# Patient Record
Sex: Male | Born: 1990 | Race: White | Hispanic: Yes | Marital: Single | Smoking: Never smoker
Health system: Southern US, Community
[De-identification: ages and names within clinical notes are randomized; demographics above are authoritative.]

---

## 2011-08-04 ENCOUNTER — Encounter (HOSPITAL_COMMUNITY): Payer: Self-pay | Admitting: *Deleted

## 2011-08-04 ENCOUNTER — Emergency Department (HOSPITAL_COMMUNITY)
Admission: EM | Admit: 2011-08-04 | Discharge: 2011-08-04 | Disposition: A | Discharge status: Home / Self Care | Payer: Self-pay | Attending: Emergency Medicine | Admitting: Emergency Medicine

## 2011-08-04 DIAGNOSIS — R509 Fever, unspecified: Secondary | ICD-10-CM | POA: Insufficient documentation

## 2011-08-04 DIAGNOSIS — R599 Enlarged lymph nodes, unspecified: Secondary | ICD-10-CM | POA: Insufficient documentation

## 2011-08-04 DIAGNOSIS — J020 Streptococcal pharyngitis: Secondary | ICD-10-CM | POA: Insufficient documentation

## 2011-08-04 DIAGNOSIS — R51 Headache: Secondary | ICD-10-CM | POA: Insufficient documentation

## 2011-08-04 DIAGNOSIS — R630 Anorexia: Secondary | ICD-10-CM | POA: Insufficient documentation

## 2011-08-04 MED ORDER — HYDROCODONE-ACETAMINOPHEN 7.5-500 MG/15ML PO SOLN: 15.0000 mL | Freq: Four times a day (QID) | ORAL | Status: AC | PRN

## 2011-08-04 MED ORDER — PENICILLIN G BENZATHINE 1200000 UNIT/2ML IM SUSP
1.2000 10*6.[IU] | Freq: Once | INTRAMUSCULAR | Status: AC
  Administered 2011-08-04: 1.2 10*6.[IU] via INTRAMUSCULAR
  Filled 2011-08-04: qty 2

## 2011-08-04 MED ORDER — DEXAMETHASONE SODIUM PHOSPHATE 10 MG/ML IJ SOLN
10.0000 mg | Freq: Once | INTRAMUSCULAR | Status: AC
  Administered 2011-08-04: 10 mg via INTRAMUSCULAR
  Filled 2011-08-04: qty 1

## 2011-08-04 NOTE — ED Notes (Signed)
Pt to ED c/o sore throat and headache x 1 week that is just getting worse to the point where it is difficult to swallow.  Tonsils swollen and red.

## 2011-08-04 NOTE — ED Notes (Signed)
sorethroat for several days

## 2011-08-04 NOTE — Discharge Instructions (Signed)
Salt Water Gargle  This solution will help make your mouth and throat feel better.  HOME CARE INSTRUCTIONS     Mix 1 teaspoon of salt in 8 ounces of warm water.   Gargle with this solution as much or often as you need or as directed. Swish and gargle gently if you have any sores or wounds in your mouth.   Do not swallow this mixture.  Document Released: 11/03/2003 Document Revised: 01/18/2011 Document Reviewed: 03/26/2008  ExitCare Patient Information 2012 [employer]    Strep Throat  Strep throat is an infection of the throat caused by a bacteria named Streptococcus pyogenes. Your caregiver may call the infection streptococcal "tonsillitis" or "pharyngitis" depending on whether there are signs of inflammation in the tonsils or back of the throat. Strep throat is most common in children from 5 to 15 years old during the cold months of the year, but it can occur in people of any age during any season. This infection is spread from person to person (contagious) through coughing, sneezing, or other close contact.  SYMPTOMS     Fever or chills.   Painful, swollen, red tonsils or throat.   Pain or difficulty when swallowing.   White or yellow spots on the tonsils or throat.   Swollen, tender lymph nodes or "glands" of the neck or under the jaw.   Red rash all over the body (rare).  DIAGNOSIS    Many different infections can cause the same symptoms. A test must be done to confirm the diagnosis so the right treatment can be given. A "rapid strep test" can help your caregiver make the diagnosis in a few minutes. If this test is not available, a light swab of the infected area can be used for a throat culture test. If a throat culture test is done, results are usually available in a day or two.  TREATMENT    Strep throat is treated with antibiotic medicine.  HOME CARE INSTRUCTIONS     Gargle with 1 tsp of salt in 1 cup of warm water, 3 to 4 times per day or as needed for comfort.    Family members who also have a sore throat or fever should be tested for strep throat and treated with antibiotics if they have the strep infection.   Make sure everyone in your household washes their hands well.   Do not share food, drinking cups, or personal items that could cause the infection to spread to others.   You may need to eat a soft food diet until your sore throat gets better.   Drink enough water and fluids to keep your urine clear or pale yellow. This will help prevent dehydration.   Get plenty of rest.   Stay home from school, daycare, or work until you have been on antibiotics for 24 hours.   Only take over-the-counter or prescription medicines for pain, discomfort, or fever as directed by your caregiver.   If antibiotics are prescribed, take them as directed. Finish them even if you start to feel better.  SEEK MEDICAL CARE IF:     The glands in your neck continue to enlarge.   You develop a rash, cough, or earache.   You cough up green, yellow-brown, or bloody sputum.   You have pain or discomfort not controlled by medicines.   Your problems seem to be getting worse rather than better.  SEEK IMMEDIATE MEDICAL CARE IF:     You develop any new symptoms such   as vomiting, severe headache, stiff or painful neck, chest pain, shortness of breath, or trouble swallowing.   You develop severe throat pain, drooling, or changes in your voice.   You develop swelling of the neck, or the skin on the neck becomes red and tender.   You have a fever.   You develop signs of dehydration, such as fatigue, dry mouth, and decreased urination.   You become increasingly sleepy, or you cannot wake up completely.  Document Released: 01/27/2000 Document Revised: 01/18/2011 Document Reviewed: 03/30/2010  ExitCare Patient Information 2012 [employer].

## 2011-08-04 NOTE — ED Provider Notes (Signed)
History     CSN: [id number]  Arrival date & time 08/04/11  0002   First MD Initiated Contact with Patient 08/04/11 0057      Chief Complaint  Patient presents with  . Sore Throat    (Consider location/radiation/quality/duration/timing/severity/associated sxs/prior treatment) HPI Comments: Patient here with sore throat that is progressively worsening over the past week - states subjective fever and chills, decrease in appetite and headache - states now with increased pain with swallowing but is able to swallow secretions.  Reports no prior history of strep throat - denies nausea, vomiting, chest pain, cough, abdominal pain.  Patient is a 21 y.o. male presenting with pharyngitis. The history is provided by the patient. No language interpreter was used.  Sore Throat This is a new problem. The current episode started in the past 7 days. The problem occurs constantly. The problem has been rapidly worsening. Associated symptoms include anorexia, chills, a fever, headaches, myalgias, a sore throat and swollen glands. Pertinent negatives include no abdominal pain, arthralgias, change in bowel habit, chest pain, congestion, coughing, diaphoresis, fatigue, joint swelling, nausea, neck pain, numbness, rash, urinary symptoms, vertigo, visual change, vomiting or weakness. The symptoms are aggravated by swallowing. He has tried nothing for the symptoms. The treatment provided no relief.    History reviewed. No pertinent past medical history.  History reviewed. No pertinent past surgical history.  No family history on file.  History  Substance Use Topics  . Smoking status: Not on file  . Smokeless tobacco: Not on file  . Alcohol Use: Not on file      Review of Systems  Constitutional: Positive for fever and chills. Negative for diaphoresis and fatigue.  HENT: Positive for sore throat. Negative for congestion, neck pain and voice change.   Respiratory: Negative for cough.   Cardiovascular:  Negative for chest pain.  Gastrointestinal: Positive for anorexia. Negative for nausea, vomiting, abdominal pain and change in bowel habit.  Musculoskeletal: Positive for myalgias. Negative for joint swelling and arthralgias.  Skin: Negative for rash.  Neurological: Positive for headaches. Negative for vertigo, weakness and numbness.  All other systems reviewed and are negative.    Allergies  Review of patient's allergies indicates no known allergies.  Home Medications   Current Outpatient Rx  Name Route Sig Dispense Refill  . AMOXICILLIN 500 MG PO CAPS Oral Take 500 mg by mouth 3 (three) times daily.      BP 140/83  Pulse 100  Temp 99.3 F (37.4 C) (Oral)  SpO2 99%  Physical Exam  Nursing note and vitals reviewed. Constitutional: He is oriented to person, place, and time. He appears well-developed and well-nourished. No distress.  HENT:  Head: Normocephalic and atraumatic.  Right Ear: External ear normal.  Left Ear: External ear normal.  Nose: Nose normal.  Mouth/Throat: Oropharyngeal exudate present.  Eyes: Conjunctivae are normal. Pupils are equal, round, and reactive to light. No scleral icterus.  Neck: Normal range of motion. Neck supple.       Bilateral anterior and posterior cervical adenopathy  Cardiovascular: Normal rate, regular rhythm and normal heart sounds.  Exam reveals no gallop and no friction rub.   No murmur heard. Pulmonary/Chest: Effort normal and breath sounds normal. No respiratory distress. He has no wheezes. He has no rales. He exhibits no tenderness.  Abdominal: Soft. Bowel sounds are normal. He exhibits no distension. There is no tenderness.  Musculoskeletal: Normal range of motion. He exhibits no edema and no tenderness.  Lymphadenopathy:  He has cervical adenopathy.  Neurological: He is alert and oriented to person, place, and time. No cranial nerve deficit. He exhibits normal muscle tone. Coordination normal.  Skin: Skin is warm and dry. No  rash noted. No erythema. No pallor.  Psychiatric: He has a normal mood and affect. His behavior is normal. Judgment and thought content normal.    ED Course  Procedures (including critical care time)   Labs Reviewed  RAPID STREP SCREEN   No results found.   Strep pharyngitis   MDM  Although his rapid strep was negative he is a CENTOR score of 4 and clinically appears to have strep throat - given injection of bicillin LA and decadron for the swelling - no not suspect PTA or ludwigs angina, patient to return if unable to handle secretions.        Izola Price [place], [place] 08/04/11 [phone]

## 2011-08-04 NOTE — ED Provider Notes (Signed)
Medical screening examination/treatment/procedure(s) were performed by non-physician practitioner and as supervising physician I was immediately available for consultation/collaboration.  Ethelda Chick, MD 08/04/11 [phone]

## 2011-08-04 NOTE — ED Notes (Signed)
im injections given will need to wait for 15 minutes before discharge

## 2015-03-21 ENCOUNTER — Observation Stay (HOSPITAL_COMMUNITY): Payer: Self-pay | Admitting: Anesthesiology

## 2015-03-21 ENCOUNTER — Emergency Department (HOSPITAL_COMMUNITY): Payer: Self-pay

## 2015-03-21 ENCOUNTER — Encounter (HOSPITAL_COMMUNITY)
Admission: EM | Disposition: A | Discharge status: Home / Self Care | Payer: Self-pay | Source: Home / Self Care | Attending: Emergency Medicine

## 2015-03-21 ENCOUNTER — Observation Stay (HOSPITAL_COMMUNITY)
Admission: EM | Admit: 2015-03-21 | Discharge: 2015-03-22 | Disposition: A | Discharge status: Home / Self Care | Payer: Self-pay | Attending: Orthopedic Surgery | Admitting: Orthopedic Surgery

## 2015-03-21 ENCOUNTER — Encounter (HOSPITAL_COMMUNITY): Payer: Self-pay | Admitting: *Deleted

## 2015-03-21 ENCOUNTER — Observation Stay (HOSPITAL_COMMUNITY): Payer: MEDICAID | Admitting: Anesthesiology

## 2015-03-21 DIAGNOSIS — S5292XA Unspecified fracture of left forearm, initial encounter for closed fracture: Secondary | ICD-10-CM

## 2015-03-21 DIAGNOSIS — S52509A Unspecified fracture of the lower end of unspecified radius, initial encounter for closed fracture: Secondary | ICD-10-CM | POA: Diagnosis present

## 2015-03-21 DIAGNOSIS — M25532 Pain in left wrist: Principal | ICD-10-CM | POA: Insufficient documentation

## 2015-03-21 DIAGNOSIS — S59202A Unspecified physeal fracture of lower end of radius, left arm, initial encounter for closed fracture: Secondary | ICD-10-CM | POA: Insufficient documentation

## 2015-03-21 DIAGNOSIS — R200 Anesthesia of skin: Secondary | ICD-10-CM | POA: Insufficient documentation

## 2015-03-21 DIAGNOSIS — W19XXXA Unspecified fall, initial encounter: Secondary | ICD-10-CM | POA: Insufficient documentation

## 2015-03-21 HISTORY — PX: OPEN REDUCTION INTERNAL FIXATION (ORIF) DISTAL RADIAL FRACTURE: SHX5989

## 2015-03-21 LAB — BASIC METABOLIC PANEL
Anion gap: 11 (ref 5–15)
BUN: 10 mg/dL (ref 6–20)
CO2: 25 mmol/L (ref 22–32)
CREATININE: 0.81 mg/dL (ref 0.61–1.24)
Calcium: 8.9 mg/dL (ref 8.9–10.3)
Chloride: 106 mmol/L (ref 101–111)
Glucose, Bld: 90 mg/dL (ref 65–99)
POTASSIUM: 3.7 mmol/L (ref 3.5–5.1)
SODIUM: 142 mmol/L (ref 135–145)

## 2015-03-21 LAB — CBC WITH DIFFERENTIAL/PLATELET
BASOS ABS: 0 10*3/uL (ref 0.0–0.1)
BASOS PCT: 0 %
EOS ABS: 0.1 10*3/uL (ref 0.0–0.7)
EOS PCT: 1 %
HCT: 44.1 % (ref 39.0–52.0)
HEMOGLOBIN: 15.7 g/dL (ref 13.0–17.0)
LYMPHS ABS: 3 10*3/uL (ref 0.7–4.0)
Lymphocytes Relative: 29 %
MCH: 31.3 pg (ref 26.0–34.0)
MCHC: 35.6 g/dL (ref 30.0–36.0)
MCV: 88 fL (ref 78.0–100.0)
Monocytes Absolute: 0.9 10*3/uL (ref 0.1–1.0)
Monocytes Relative: 9 %
NEUTROS PCT: 61 %
Neutro Abs: 6.2 10*3/uL (ref 1.7–7.7)
PLATELETS: 205 10*3/uL (ref 150–400)
RBC: 5.01 MIL/uL (ref 4.22–5.81)
RDW: 12.5 % (ref 11.5–15.5)
WBC: 10.3 10*3/uL (ref 4.0–10.5)

## 2015-03-21 LAB — SURGICAL PCR SCREEN
MRSA, PCR: NEGATIVE
STAPHYLOCOCCUS AUREUS: NEGATIVE

## 2015-03-21 SURGERY — OPEN REDUCTION INTERNAL FIXATION (ORIF) DISTAL RADIUS FRACTURE
Anesthesia: General | Site: Arm Lower | Laterality: Left

## 2015-03-21 MED ORDER — HYDROMORPHONE HCL 1 MG/ML IJ SOLN
1.0000 mg | Freq: Once | INTRAMUSCULAR | Status: AC
  Administered 2015-03-21: 1 mg via INTRAMUSCULAR

## 2015-03-21 MED ORDER — PROPOFOL 10 MG/ML IV BOLUS
INTRAVENOUS | Status: DC | PRN
  Administered 2015-03-21: 200 mg via INTRAVENOUS

## 2015-03-21 MED ORDER — ALPRAZOLAM 0.5 MG PO TABS
0.5000 mg | ORAL_TABLET | Freq: Four times a day (QID) | ORAL | Status: DC | PRN
  Administered 2015-03-22 (×3): 0.5 mg via ORAL
  Filled 2015-03-21 (×3): qty 1

## 2015-03-21 MED ORDER — 0.9 % SODIUM CHLORIDE (POUR BTL) OPTIME
TOPICAL | Status: DC | PRN
  Administered 2015-03-21: 1000 mL

## 2015-03-21 MED ORDER — FENTANYL CITRATE (PF) 250 MCG/5ML IJ SOLN
INTRAMUSCULAR | Status: AC
  Filled 2015-03-21: qty 5

## 2015-03-21 MED ORDER — BUPIVACAINE HCL (PF) 0.25 % IJ SOLN: INTRAMUSCULAR | Status: DC | PRN

## 2015-03-21 MED ORDER — LIDOCAINE HCL (CARDIAC) 20 MG/ML IV SOLN
INTRAVENOUS | Status: DC | PRN
  Administered 2015-03-21: 60 mg via INTRAVENOUS

## 2015-03-21 MED ORDER — VITAMIN C 500 MG PO TABS
1000.0000 mg | ORAL_TABLET | Freq: Every day | ORAL | Status: DC
  Administered 2015-03-22: 1000 mg via ORAL
  Filled 2015-03-21: qty 2

## 2015-03-21 MED ORDER — OXYCODONE HCL 5 MG PO TABS
ORAL_TABLET | ORAL | Status: AC
  Administered 2015-03-21: 10 mg via ORAL
  Filled 2015-03-21: qty 2

## 2015-03-21 MED ORDER — BUPIVACAINE HCL (PF) 0.25 % IJ SOLN
INTRAMUSCULAR | Status: AC
  Filled 2015-03-21: qty 30

## 2015-03-21 MED ORDER — MORPHINE SULFATE (PF) 2 MG/ML IV SOLN
1.0000 mg | INTRAVENOUS | Status: DC | PRN
  Administered 2015-03-21 – 2015-03-22 (×5): 1 mg via INTRAVENOUS
  Filled 2015-03-21 (×5): qty 1

## 2015-03-21 MED ORDER — ONDANSETRON HCL 4 MG PO TABS: 4.0000 mg | ORAL_TABLET | Freq: Four times a day (QID) | ORAL | Status: DC | PRN

## 2015-03-21 MED ORDER — ONDANSETRON HCL 4 MG/2ML IJ SOLN
INTRAMUSCULAR | Status: AC
  Filled 2015-03-21: qty 4

## 2015-03-21 MED ORDER — FAMOTIDINE 20 MG PO TABS: 20.0000 mg | ORAL_TABLET | Freq: Two times a day (BID) | ORAL | Status: DC | PRN

## 2015-03-21 MED ORDER — CEFAZOLIN SODIUM-DEXTROSE 2-3 GM-% IV SOLR
INTRAVENOUS | Status: DC | PRN
  Administered 2015-03-21: 2 g via INTRAVENOUS

## 2015-03-21 MED ORDER — DOCUSATE SODIUM 100 MG PO CAPS
100.0000 mg | ORAL_CAPSULE | Freq: Two times a day (BID) | ORAL | Status: DC
  Administered 2015-03-21 – 2015-03-22 (×2): 100 mg via ORAL
  Filled 2015-03-21 (×2): qty 1

## 2015-03-21 MED ORDER — HYDROMORPHONE HCL 1 MG/ML IJ SOLN
0.5000 mg | INTRAMUSCULAR | Status: AC | PRN
  Administered 2015-03-21 (×4): 0.5 mg via INTRAVENOUS

## 2015-03-21 MED ORDER — CEFAZOLIN SODIUM-DEXTROSE 2-3 GM-% IV SOLR
2.0000 g | Freq: Three times a day (TID) | INTRAVENOUS | Status: DC
  Administered 2015-03-22 (×2): 2 g via INTRAVENOUS
  Filled 2015-03-21 (×5): qty 50

## 2015-03-21 MED ORDER — OXYCODONE HCL 5 MG PO TABS
5.0000 mg | ORAL_TABLET | ORAL | Status: DC | PRN
  Administered 2015-03-21 – 2015-03-22 (×8): 10 mg via ORAL
  Filled 2015-03-21 (×7): qty 2

## 2015-03-21 MED ORDER — METHOCARBAMOL 500 MG PO TABS
500.0000 mg | ORAL_TABLET | Freq: Four times a day (QID) | ORAL | Status: DC | PRN
  Administered 2015-03-22 (×2): 500 mg via ORAL
  Filled 2015-03-21 (×2): qty 1

## 2015-03-21 MED ORDER — MIDAZOLAM HCL 5 MG/5ML IJ SOLN
INTRAMUSCULAR | Status: DC | PRN
  Administered 2015-03-21: 2 mg via INTRAVENOUS

## 2015-03-21 MED ORDER — FENTANYL CITRATE (PF) 100 MCG/2ML IJ SOLN
INTRAMUSCULAR | Status: DC | PRN
  Administered 2015-03-21: 100 ug via INTRAVENOUS
  Administered 2015-03-21: 50 ug via INTRAVENOUS
  Administered 2015-03-21: 100 ug via INTRAVENOUS

## 2015-03-21 MED ORDER — PROMETHAZINE HCL 25 MG RE SUPP
12.5000 mg | Freq: Four times a day (QID) | RECTAL | Status: DC | PRN
  Filled 2015-03-21: qty 1

## 2015-03-21 MED ORDER — OXYCODONE-ACETAMINOPHEN 5-325 MG PO TABS: 1.0000 | ORAL_TABLET | Freq: Four times a day (QID) | ORAL | Status: AC | PRN

## 2015-03-21 MED ORDER — HYDROMORPHONE HCL 1 MG/ML IJ SOLN
INTRAMUSCULAR | Status: AC
  Administered 2015-03-21: 1 mg via INTRAMUSCULAR
  Filled 2015-03-21: qty 1

## 2015-03-21 MED ORDER — ONDANSETRON 4 MG PO TBDP
8.0000 mg | ORAL_TABLET | Freq: Once | ORAL | Status: AC
  Administered 2015-03-21: 8 mg via ORAL
  Filled 2015-03-21: qty 2

## 2015-03-21 MED ORDER — HYDROMORPHONE HCL 1 MG/ML IJ SOLN
INTRAMUSCULAR | Status: AC
  Administered 2015-03-21: 0.5 mg via INTRAVENOUS
  Filled 2015-03-21: qty 1

## 2015-03-21 MED ORDER — ONDANSETRON HCL 4 MG/2ML IJ SOLN: 4.0000 mg | Freq: Once | INTRAMUSCULAR | Status: DC | PRN

## 2015-03-21 MED ORDER — LIDOCAINE HCL (CARDIAC) 20 MG/ML IV SOLN
INTRAVENOUS | Status: AC
  Filled 2015-03-21: qty 10

## 2015-03-21 MED ORDER — MORPHINE SULFATE (PF) 4 MG/ML IV SOLN: 4.0000 mg | Freq: Once | INTRAVENOUS | Status: DC

## 2015-03-21 MED ORDER — LACTATED RINGERS IV SOLN
INTRAVENOUS | Status: DC
  Administered 2015-03-21: 08:00:00 via INTRAVENOUS

## 2015-03-21 MED ORDER — ONDANSETRON HCL 4 MG/2ML IJ SOLN
INTRAMUSCULAR | Status: DC | PRN
  Administered 2015-03-21: 4 mg via INTRAVENOUS

## 2015-03-21 MED ORDER — HYDROMORPHONE HCL 1 MG/ML IJ SOLN
1.0000 mg | Freq: Once | INTRAMUSCULAR | Status: AC
  Administered 2015-03-21: 1 mg via INTRAMUSCULAR
  Filled 2015-03-21: qty 1

## 2015-03-21 MED ORDER — OXYCODONE-ACETAMINOPHEN 5-325 MG PO TABS
1.0000 | ORAL_TABLET | Freq: Once | ORAL | Status: AC
  Administered 2015-03-21: 1 via ORAL
  Filled 2015-03-21: qty 1

## 2015-03-21 MED ORDER — CEFAZOLIN SODIUM-DEXTROSE 2-3 GM-% IV SOLR
INTRAVENOUS | Status: AC
  Filled 2015-03-21: qty 50

## 2015-03-21 MED ORDER — METHOCARBAMOL 1000 MG/10ML IJ SOLN
500.0000 mg | Freq: Four times a day (QID) | INTRAVENOUS | Status: DC | PRN
  Administered 2015-03-21: 500 mg via INTRAVENOUS
  Filled 2015-03-21 (×4): qty 5

## 2015-03-21 MED ORDER — ONDANSETRON HCL 4 MG/2ML IJ SOLN: 4.0000 mg | Freq: Four times a day (QID) | INTRAMUSCULAR | Status: DC | PRN

## 2015-03-21 MED ORDER — LIDOCAINE HCL 2 % IJ SOLN
15.0000 mL | Freq: Once | INTRAMUSCULAR | Status: AC
  Administered 2015-03-21: 300 mg
  Filled 2015-03-21: qty 20

## 2015-03-21 MED ORDER — PHENYLEPHRINE 40 MCG/ML (10ML) SYRINGE FOR IV PUSH (FOR BLOOD PRESSURE SUPPORT)
PREFILLED_SYRINGE | INTRAVENOUS | Status: AC
  Filled 2015-03-21: qty 20

## 2015-03-21 MED ORDER — MIDAZOLAM HCL 2 MG/2ML IJ SOLN
INTRAMUSCULAR | Status: AC
  Filled 2015-03-21: qty 2

## 2015-03-21 MED ORDER — LACTATED RINGERS IV SOLN
INTRAVENOUS | Status: DC
  Administered 2015-03-21: 16:00:00 via INTRAVENOUS

## 2015-03-21 MED ORDER — LACTATED RINGERS IV SOLN
INTRAVENOUS | Status: DC | PRN
  Administered 2015-03-21: 17:00:00 via INTRAVENOUS

## 2015-03-21 SURGICAL SUPPLY — 63 items
BANDAGE ACE 3X5.8 VEL STRL LF (GAUZE/BANDAGES/DRESSINGS) ×3 IMPLANT
BANDAGE ACE 4X5 VEL STRL LF (GAUZE/BANDAGES/DRESSINGS) ×3 IMPLANT
BANDAGE ELASTIC 3 VELCRO ST LF (GAUZE/BANDAGES/DRESSINGS) IMPLANT
BANDAGE ELASTIC 4 VELCRO ST LF (GAUZE/BANDAGES/DRESSINGS) IMPLANT
BIT DRILL 110X2.5XQCK CNCT (BIT) ×1 IMPLANT
BIT DRILL 2.5 (BIT) ×2
BIT DRILL 2.7 (BIT) ×1
BIT DRILL 2.7MM (BIT) ×1 IMPLANT
BIT DRL 110X2.5XQCK CNCT (BIT) ×1
BLADE SURG ROTATE 9660 (MISCELLANEOUS) IMPLANT
BNDG ESMARK 4X9 LF (GAUZE/BANDAGES/DRESSINGS) ×3 IMPLANT
BNDG GAUZE ELAST 4 BULKY (GAUZE/BANDAGES/DRESSINGS) ×3 IMPLANT
CORDS BIPOLAR (ELECTRODE) ×3 IMPLANT
COVER SURGICAL LIGHT HANDLE (MISCELLANEOUS) ×3 IMPLANT
CUFF TOURNIQUET SINGLE 18IN (TOURNIQUET CUFF) ×3 IMPLANT
CUFF TOURNIQUET SINGLE 24IN (TOURNIQUET CUFF) IMPLANT
DECANTER SPIKE VIAL GLASS SM (MISCELLANEOUS) IMPLANT
DRAIN TLS ROUND 10FR (DRAIN) IMPLANT
DRAPE OEC MINIVIEW 54X84 (DRAPES) IMPLANT
DRAPE U-SHAPE 47X51 STRL (DRAPES) ×3 IMPLANT
DRILL BIT 2.7MM (BIT) ×2
DRSG ADAPTIC 3X8 NADH LF (GAUZE/BANDAGES/DRESSINGS) ×3 IMPLANT
GAUZE SPONGE 4X4 12PLY STRL (GAUZE/BANDAGES/DRESSINGS) ×3 IMPLANT
GAUZE XEROFORM 5X9 LF (GAUZE/BANDAGES/DRESSINGS) ×3 IMPLANT
GLOVE ORTHO TXT STRL SZ7.5 (GLOVE) ×3 IMPLANT
GLOVE SS BIOGEL STRL SZ 8 (GLOVE) ×1 IMPLANT
GLOVE SUPERSENSE BIOGEL SZ 8 (GLOVE) ×2
GOWN STRL REUS W/ TWL LRG LVL3 (GOWN DISPOSABLE) ×3 IMPLANT
GOWN STRL REUS W/ TWL XL LVL3 (GOWN DISPOSABLE) ×3 IMPLANT
GOWN STRL REUS W/TWL LRG LVL3 (GOWN DISPOSABLE) ×6
GOWN STRL REUS W/TWL XL LVL3 (GOWN DISPOSABLE) ×6
KIT BASIN OR (CUSTOM PROCEDURE TRAY) ×3 IMPLANT
KIT ROOM TURNOVER OR (KITS) ×3 IMPLANT
LOOP VESSEL MAXI BLUE (MISCELLANEOUS) IMPLANT
MANIFOLD NEPTUNE II (INSTRUMENTS) IMPLANT
NEEDLE 22X1 1/2 (OR ONLY) (NEEDLE) IMPLANT
NS IRRIG 1000ML POUR BTL (IV SOLUTION) ×3 IMPLANT
PACK ORTHO EXTREMITY (CUSTOM PROCEDURE TRAY) ×3 IMPLANT
PAD ARMBOARD 7.5X6 YLW CONV (MISCELLANEOUS) ×6 IMPLANT
PAD CAST 4YDX4 CTTN HI CHSV (CAST SUPPLIES) ×2 IMPLANT
PADDING CAST COTTON 4X4 STRL (CAST SUPPLIES) ×4
PLATE 7 HOLE (Plate) ×3 IMPLANT
SCREW CORTICAL 3.5 16MM (Screw) ×9 IMPLANT
SCREW CORTICAL 3.5 18MM (Screw) ×6 IMPLANT
SCREW LOCKING 16X3.5MM (Screw) ×3 IMPLANT
SCREW LOCKING 18X3.5MM (Screw) ×3 IMPLANT
SPLINT FIBERGLASS 3X35 (CAST SUPPLIES) ×3 IMPLANT
SPONGE LAP 4X18 X RAY DECT (DISPOSABLE) IMPLANT
SUCTION FRAZIER HANDLE 10FR (MISCELLANEOUS) ×2
SUCTION TUBE FRAZIER 10FR DISP (MISCELLANEOUS) ×1 IMPLANT
SUT MNCRL AB 4-0 PS2 18 (SUTURE) IMPLANT
SUT PROLENE 3 0 PS 2 (SUTURE) IMPLANT
SUT PROLENE 4 0 PS 2 18 (SUTURE) ×3 IMPLANT
SUT VIC AB 3-0 FS2 27 (SUTURE) ×3 IMPLANT
SYR CONTROL 10ML LL (SYRINGE) ×3 IMPLANT
SYSTEM CHEST DRAIN TLS 7FR (DRAIN) IMPLANT
TOWEL OR 17X24 6PK STRL BLUE (TOWEL DISPOSABLE) ×3 IMPLANT
TOWEL OR 17X26 10 PK STRL BLUE (TOWEL DISPOSABLE) ×3 IMPLANT
TUBE CONNECTING 12'X1/4 (SUCTIONS) ×1
TUBE CONNECTING 12X1/4 (SUCTIONS) ×2 IMPLANT
TUBE EVACUATION TLS (MISCELLANEOUS) ×3 IMPLANT
UNDERPAD 30X30 INCONTINENT (UNDERPADS AND DIAPERS) ×3 IMPLANT
WATER STERILE IRR 1000ML POUR (IV SOLUTION) ×3 IMPLANT

## 2015-03-21 NOTE — Anesthesia Procedure Notes (Signed)
Procedure Name: LMA Insertion Date/Time: 03/21/2015 4:39 PM Performed by: Gavin Pound, Wei Poplaski J Pre-anesthesia Checklist: Patient identified, Emergency Drugs available, Suction available, Patient being monitored and Timeout performed Patient Re-evaluated:Patient Re-evaluated prior to inductionOxygen Delivery Method: Circle system utilized Preoxygenation: Pre-oxygenation with 100% oxygen Intubation Type: IV induction Ventilation: Mask ventilation without difficulty LMA: LMA inserted Placement Confirmation: positive ETCO2 and breath sounds checked- equal and bilateral Tube secured with: Tape Dental Injury: Teeth and Oropharynx as per pre-operative assessment

## 2015-03-21 NOTE — ED Provider Notes (Signed)
CSN: [id number]     Arrival date & time 03/21/15  0030 History  By signing my name below, I, Ronney Lion, attest that this documentation has been prepared under the direction and in the presence of Shon Baton, MD. Electronically Signed: Ronney Lion, ED Scribe. 03/21/2015. 5:13 AM.   Chief Complaint  Patient presents with  . Wrist Pain   The history is provided by the patient. No language interpreter was used.   HPI Comments: Lonnie Brock is a 25 y.o. male who presents to the Emergency Department complaining of sudden-onset, constant, 10/10 left forearm pain S/P a FOOSH injury that occurred PTA. He states he landed with all of the impact on his outstretched left arm. Patient states he has not taken any medications for his symptoms. Patient is right-hand-dominant. He denies a history of any chronic medical conditions or regular medications. Patient has NKDA.   History reviewed. No pertinent past medical history. History reviewed. No pertinent past surgical history. No family history on file. Social History  Substance Use Topics  . Smoking status: Never Smoker   . Smokeless tobacco: None  . Alcohol Use: Yes    Review of Systems  Musculoskeletal: Positive for arthralgias.       Left arm pain  Neurological: Negative for numbness.  All other systems reviewed and are negative.   Allergies  Review of patient's allergies indicates no known allergies.  Home Medications   Prior to Admission medications   Medication Sig Start Date End Date Taking? Authorizing Provider  oxyCODONE-acetaminophen (PERCOCET/ROXICET) 5-325 MG tablet Take 1 tablet by mouth every 6 (six) hours as needed for severe pain. 03/21/15   Shon Baton, MD   BP 129/65 mmHg  Pulse 72  Temp(Src) 98.4 F (36.9 C) (Oral)  Resp 20  Ht  (1.778 m)  Wt 177 lb 1 oz (80.315 kg)  BMI 25.41 kg/m2  SpO2 93% Physical Exam  Constitutional: He is oriented to person, place, and time. He appears well-developed and  well-nourished. No distress.  HENT:  Head: Normocephalic and atraumatic.  Cardiovascular: Normal rate, regular rhythm and normal heart sounds.   No murmur heard. Pulmonary/Chest: Effort normal and breath sounds normal. No respiratory distress. He has no wheezes.  Musculoskeletal:  Obvious deformity to the mid distal left forearm, 2+ radial pulse, neurovascularly intact, compartments soft  Neurological: He is alert and oriented to person, place, and time.  Skin: Skin is warm and dry.  Psychiatric: He has a normal mood and affect. His behavior is normal.  Nursing note and vitals reviewed.   ED Course  Reduction of fracture Date/Time: 03/21/2015 6:53 AM Performed by: Shon Baton Authorized by: Shon Baton Consent: Verbal consent obtained. Risks and benefits: risks, benefits and alternatives were discussed Consent given by: patient Local anesthesia used: yes Anesthesia: hematoma block Local anesthetic: lidocaine 2% without epinephrine Anesthetic total: 8 ml Patient tolerance: Patient tolerated the procedure well with no immediate complications Comments: Initial reduction with improvement of deformity objectively, postreduction films with successful reduction in one plane, second attempt with no improvement, patient remained neurovascular intact   (including critical care time)  DIAGNOSTIC STUDIES: Oxygen Saturation is 97% on RA, normal by my interpretation.    COORDINATION OF CARE: 2:13 AM - Discussed treatment plan with pt at bedside which includes pain medication, splint placement, and hand surgeon f/u. Pt verbalized understanding and agreed to plan.    Imaging Review Dg Forearm Left  03/21/2015  CLINICAL DATA:  Status post attempted  reduction of left forearm fracture. Initial encounter. EXAM: LEFT FOREARM - 2 VIEW COMPARISON:  Left forearm radiographs performed earlier today at 3:49 a.m. FINDINGS: There is persistent mild shortening at the fracture site, with 1 shaft  width volar displacement of the distal radial diaphyseal fracture. Mild radial displacement is also noted. The degree of displacement appears slightly worsened from the prior study. No new fractures are seen. The carpal rows appear grossly intact, and demonstrate normal alignment. Soft tissue swelling is noted, difficult to fully assess due to the overlying splint. IMPRESSION: Persistent mild shortening and 1 shaft width volar displacement of the distal radial diaphyseal fracture, with mild radial displacement also seen. The degree of displacement appears slightly worsened from the prior study. Electronically Signed   By: Roanna Raider M.D.   On: 03/21/2015 04:50   Dg Forearm Left  03/21/2015  CLINICAL DATA:  25 year old male with fracture of distal forearm status post reduction. EXAM: LEFT FOREARM - 2 VIEW COMPARISON:  Earlier radiograph dated 03/21/2015 FINDINGS: There is fracture of the distal third of the radius with volar displacement of the distal fracture fragment similar to the prior study. There has been interval reduction of the radial dislocation of the distal fracture fragment of the radius. Bony fracture identified. A cast is noted over the forearm. IMPRESSION: Fracture of the distal third of the radius with volar displacement of the distal fracture fragment. Electronically Signed   By: Elgie Collard M.D.   On: 03/21/2015 04:01   Dg Forearm Left  03/21/2015  CLINICAL DATA:  Larey Seat and caught himself with arm. Left forearm pain and deformity. Initial encounter. EXAM: LEFT FOREARM - 2 VIEW COMPARISON:  None. FINDINGS: There is a displaced mildly comminuted fracture involving the distal radial diaphysis, with approximately 1 shaft width ulnar and volar displacement. Mild associated dorsal angulation is seen. No definite additional fracture is identified. A tiny osseous fragment adjacent to the ulnar styloid may reflect remote injury. Soft swelling is noted about the forearm. The elbow joint is  unremarkable in appearance. No elbow joint effusion is seen. IMPRESSION: Displaced mildly comminuted fracture involving the distal radial diaphysis, with approximately 1 shaft width ulnar and volar displacement. Mild associated dorsal angulation seen. Electronically Signed   By: Roanna Raider M.D.   On: 03/21/2015 01:08   I have personally reviewed and evaluated these images and lab results as part of my medical decision-making.  MDM   Final diagnoses:  Radius fracture, left, closed, initial encounter   Patient presents with a distal radius fracture.  Closed. Neurovascularly intact. Attempt at reduction unsuccessful likely given the overlying lying nature of the fracture. Discussed with Dr. Amanda Pea.  He evaluated the patient. He will admit for reduction and surgery later today.  I personally performed the services described in this documentation, which was scribed in my presence. The recorded information has been reviewed and is accurate.      Shon Baton, MD 03/21/15 [phone]

## 2015-03-21 NOTE — Op Note (Signed)
See dictation#[id number] Cina Klumpp MD

## 2015-03-21 NOTE — Evaluation (Signed)
Physical Therapy Evaluation Patient Details Name: Lonnie Brock MRN: [id number] DOB: 1991-01-30 Today's Date: 03/21/2015   History of Present Illness  Lonnie Brock is a 25 y.o. male who presents to the Emergency Department complaining of sudden-onset, constant, 10/10 left forearm pain S/P a FOOSH injury that occurred PTA. He states he landed with all of the impact on his outstretched left arm. Patient states he has not taken any medications for his symptoms. Patient is right-hand-dominant. He denies a history of any chronic medical conditions or regular medications. Patient has NKDA.   Clinical Impression  Pt admitted with above diagnosis. Pt currently does not have have functional limitations and pt is independent in all functional mobility. Defer other ADL task training to OT. Pt states his girlfriend will be able to stay with him for a couple of days to help him out if he needs it. No further PT services needed at this time.      Follow Up Recommendations No PT follow up    Equipment Recommendations  None recommended by PT    Recommendations for Other Services       Precautions / Restrictions Precautions Precautions: Fall;Other (comment) (NWB L UE) Precaution Comments: L UE in sling when OOB Required Braces or Orthoses: Sling Restrictions Weight Bearing Restrictions: Yes LUE Weight Bearing: Non weight bearing Other Position/Activity Restrictions: keep elevated for edema control       Mobility  Bed Mobility Overal bed mobility: Independent                Transfers Overall transfer level: Independent Equipment used: None                Ambulation/Gait Ambulation/Gait assistance: Independent Ambulation Distance (Feet): 300 Feet Assistive device: None Gait Pattern/deviations: WFL(Within Functional Limits)   Gait velocity interpretation: at or above normal speed for age/gender    Stairs Stairs: Yes Stairs assistance: Independent Stair Management: No  rails Number of Stairs: 12 General stair comments: no assistance or supervision needed and no AD or rail needed.   Wheelchair Mobility    Modified Rankin (Stroke Patients Only)       Balance Overall balance assessment: Independent                                           Pertinent Vitals/Pain Pain Assessment: 0-10 Pain Score: 8  Pain Location: L UE Pain Descriptors / Indicators: Aching Pain Intervention(s): Limited activity within patient's tolerance  Pt states increased pain with movement, but able to tolerate pain when L UE is supported in sling for all functional mobility.     Home Living Family/patient expects to be discharged to:: Private residence Living Arrangements: Alone Available Help at Discharge: Family;Friend(s);Available 24 hours/day Type of Home: Apartment Home Access: Stairs to enter Entrance Stairs-Rails: Doctor, general practice of Steps: 12 Home Layout: Two level;Bed/bath upstairs Home Equipment: None Additional Comments: Pt states his girlfriend can stay with him a few days to help him out and drive him home.     Prior Function Level of Independence: Independent               Hand Dominance   Dominant Hand: Right    Extremity/Trunk Assessment   Upper Extremity Assessment: LUE deficits/detail       LUE Deficits / Details: Pt in splint at 90 degrees of flexion at the elbow with NWB precautions. Pt able  to move L shoulder WFL, but painful. Pt unable to fully extend L fingers but can move passively with other hand assisting.    Lower Extremity Assessment: Overall WFL for tasks assessed      Cervical / Trunk Assessment: Normal  Communication   Communication: No difficulties  Cognition Arousal/Alertness: Awake/alert Behavior During Therapy: WFL for tasks assessed/performed Overall Cognitive Status: Within Functional Limits for tasks assessed                      General Comments      Exercises         Assessment/Plan    PT Assessment Patent does not need any further PT services  PT Diagnosis     PT Problem List    PT Treatment Interventions     PT Goals (Current goals can be found in the Care Plan section) Acute Rehab PT Goals Patient Stated Goal: To have surgery and go home.  PT Goal Formulation: With patient    Frequency     Barriers to discharge        Co-evaluation               End of Session Equipment Utilized During Treatment: Gait belt Activity Tolerance: Patient tolerated treatment well Patient left: in bed;with call bell/phone within reach Nurse Communication: Mobility status    Functional Assessment Tool Used: clinical judgment Functional Limitation: Mobility: Walking and moving around Mobility: Walking and Moving Around Current Status [phone]): 0 percent impaired, limited or restricted Mobility: Walking and Moving Around Goal Status [phone]): 0 percent impaired, limited or restricted Mobility: Walking and Moving Around Discharge Status [phone]): 0 percent impaired, limited or restricted    Time: 0928-0939 PT Time Calculation (min) (ACUTE ONLY): 11 min   Charges:   PT Evaluation $PT Eval Low Complexity: 1 Procedure     PT G Codes:   PT G-Codes **NOT FOR INPATIENT CLASS** Functional Assessment Tool Used: clinical judgment Functional Limitation: Mobility: Walking and moving around Mobility: Walking and Moving Around Current Status ([id number]): 0 percent impaired, limited or restricted Mobility: Walking and Moving Around Goal Status ([id number]): 0 percent impaired, limited or restricted Mobility: Walking and Moving Around Discharge Status [phone]): 0 percent impaired, limited or restricted   Everlean Cherry, SPT  [place], [place] F 03/21/2015, 12:42 PM  I have read, reviewed and agree with student's note.   [facility] Acute Rehabilitation [phone] (pager)

## 2015-03-21 NOTE — Transfer of Care (Signed)
Immediate Anesthesia Transfer of Care Note  Patient: Marce Haymond  Procedure(s) Performed: Procedure(s): OPEN REDUCTION INTERNAL FIXATION (ORIF) DISTAL RADIAL FRACTURE (Left)  Patient Location: PACU  Anesthesia Type:General  Level of Consciousness: awake  Airway & Oxygen Therapy: Patient Spontanous Breathing  Post-op Assessment: Report given to RN and Post -op Vital signs reviewed and stable  Post vital signs: Reviewed and stable  Last Vitals:  Filed Vitals:   03/21/15 1818 03/21/15 1820  BP: 161/88   Pulse: 117   Temp:  36.6 C  Resp: 17     Complications: No apparent anesthesia complications 

## 2015-03-21 NOTE — Progress Notes (Signed)
Occupational Therapy Evaluation Patient Details Name: Lonnie Brock MRN: [id number] DOB: Jul 06, 1990 Today's Date: 03/21/2015    History of Present Illness Lonnie Brock is a 25 y.o. male who presents to the Emergency Department complaining of sudden-onset, constant, 10/10 left forearm pain S/P a FOOSH injury that occurred PTA. Per chart, He states he landed with all of the impact on his outstretched left arm. Patient has NKDA.    Clinical Impression   PTA, pt independent with ADL and mobility and worked as a Curator. Pt presents with decreased functional use LUE due to deficits below. Educated pt/family on compensatory techniques for ADL, edema control and management of LUE. C/o minimal "abnormal sensation" in median n distribution but function apprears WNL. Pt to follow up with Dr. Butler Denmark to progress rehab of L wrist if needed.     Follow Up Recommendations  Other (comment);Supervision - Intermittent (follow up with Dr. Butler Denmark who will progress therapy if need)    Equipment Recommendations    none   Recommendations for Other Services       Precautions / Restrictions Precautions Precautions: Fall;Other (comment) (NWB L UE) Precaution Comments: L UE in sling when OOB Required Braces or Orthoses: Sling Restrictions Weight Bearing Restrictions: Yes LUE Weight Bearing: Non weight bearing Other Position/Activity Restrictions: keep elevated for edema control       Mobility Bed Mobility Overal bed mobility: Independent                Transfers Overall transfer level: Independent Equipment used: None                  Balance Overall balance assessment: Independent                                          ADL Overall ADL's : Needs assistance/impaired                                     Functional mobility during ADLs: Independent General ADL Comments: Completed education regarding compensatory techniques for ADL Discussed  clothing options and need to refrain from pulling, pushing or lifting duering ADL.Pt/girlfriend verbalized understanding. Educated on importance of edema control using ice and elevation and massge.      Vision     Perception     Praxis      Pertinent Vitals/Pain Pain Assessment: 0-10 Pain Score: 8  Pain Location: L UE Pain Descriptors / Indicators: Aching Pain Intervention(s): Limited activity within patient's tolerance     Hand Dominance Right   Extremity/Trunk Assessment Upper Extremity Assessment Upper Extremity Assessment: LUE deficits/detail LUE Deficits / Details: Pt in splint at 90 degrees of flexion at the elbow with NWB precautions. Pt able to move L shoulder WFL, but painful. Pt unable to fully extend L fingers but can move passively with other hand assisting.    Lower Extremity Assessment Lower Extremity Assessment: Overall WFL for tasks assessed   Cervical / Trunk Assessment Cervical / Trunk Assessment: Normal   Communication Communication Communication: No difficulties   Cognition Arousal/Alertness: Awake/alert Behavior During Therapy: WFL for tasks assessed/performed Overall Cognitive Status: Within Functional Limits for tasks assessed                     General Comments       Exercises Exercises:  Other exercises Other Exercises Other Exercises: shoulder AROM as tolerated Other Exercises: digit A/AAROM within pain tolerance. given tendon gliding ex.   Shoulder Instructions      Home Living Family/patient expects to be discharged to:: Private residence Living Arrangements: Alone Available Help at Discharge: Family;Friend(s);Available 24 hours/day Type of Home: Apartment Home Access: Stairs to enter Entrance Stairs-Number of Steps: 12 Entrance Stairs-Rails: Right;Left Home Layout: Two level;Bed/bath upstairs     Bathroom Shower/Tub: Chief Strategy Officer: Standard     Home Equipment: None   Additional Comments: Pt  states his girlfriend can stay with him a few days to help him out and drive him home.       Prior Functioning/Environment Level of Independence: Independent             OT Diagnosis: Generalized weakness;Acute pain   OT Problem List: Decreased strength;Decreased range of motion;Pain;Impaired UE functional use   OT Treatment/Interventions: Self-care/ADL training;Therapeutic exercise;Therapeutic activities;Patient/family education    OT Goals(Current goals can be found in the care plan section) Acute Rehab OT Goals Patient Stated Goal: To have surgery and go home.  OT Goal Formulation: With patient Time For Goal Achievement: 03/28/15 Potential to Achieve Goals: Good ADL Goals Pt/caregiver will Perform Home Exercise Program: Left upper extremity;Increased ROM;With written HEP provided Additional ADL Goal #1: Independent with edema control and managment of LUE for ADL  OT Frequency: Min 2X/week   Barriers to D/C:            Co-evaluation              End of Session Nurse Communication: Mobility status  Activity Tolerance: Patient tolerated treatment well Patient left: in bed;with call bell/phone within reach;with family/visitor present   Time: 1610-9604 OT Time Calculation (min): 26 min Charges:  OT General Charges $OT Visit: 1 Procedure OT Evaluation $OT Eval Low Complexity: 1 Procedure OT Treatments $Self Care/Home Management : 8-22 mins G-Codes: OT G-codes **NOT FOR INPATIENT CLASS** Functional Assessment Tool Used: clinical judgement Functional Limitation: Self care Self Care Current Status (V4098): At least 1 percent but less than 20 percent impaired, limited or restricted Self Care Goal Status (J1914): At least 1 percent but less than 20 percent impaired, limited or restricted  Trigg Delarocha,HILLARY 03/21/2015, 1:07 PM   [facility], OTR/L  [phone] 03/21/2015

## 2015-03-21 NOTE — ED Notes (Signed)
Gramig MD at bedside

## 2015-03-21 NOTE — Transfer of Care (Signed)
Immediate Anesthesia Transfer of Care Note  Patient: Lonnie Brock  Procedure(s) Performed: Procedure(s): OPEN REDUCTION INTERNAL FIXATION (ORIF) DISTAL RADIAL FRACTURE (Left)  Patient Location: PACU  Anesthesia Type:General  Level of Consciousness: awake  Airway & Oxygen Therapy: Patient Spontanous Breathing  Post-op Assessment: Report given to RN and Post -op Vital signs reviewed and stable  Post vital signs: Reviewed and stable  Last Vitals:  Filed Vitals:   03/21/15 1818 03/21/15 1820  BP: 161/88   Pulse: 117   Temp:  36.6 C  Resp: 17     Complications: No apparent anesthesia complications

## 2015-03-21 NOTE — H&P (Signed)
Lonnie Brock is an 25 y.o. male.   Chief Complaint: Left displaced radial shaft fracture HPI: Status post fall 1 AM to 2 AM this evening with resultant left forearm fracture. Patient is displaced left forearm fracture. I reviewed the ER notes at great length and discussed with Dr. Wilkie Aye his predicament.  He denies neck back chest or abdominal pain  He is with his girlfriend he is alert and oriented.  He notes pain in his forearm.  He has sensation to the fingers.  History reviewed. No pertinent past medical history.  History reviewed. No pertinent past surgical history.  No family history on file. Social History:  reports that he has never smoked. He does not have any smokeless tobacco history on file. He reports that he drinks alcohol. His drug history is not on file.  Allergies: No Known Allergies   (Not in a hospital admission)  No results found for this or any previous visit (from the past 48 hour(s)). Dg Forearm Left  03/21/2015  CLINICAL DATA:  Status post attempted reduction of left forearm fracture. Initial encounter. EXAM: LEFT FOREARM - 2 VIEW COMPARISON:  Left forearm radiographs performed earlier today at 3:49 a.m. FINDINGS: There is persistent mild shortening at the fracture site, with 1 shaft width volar displacement of the distal radial diaphyseal fracture. Mild radial displacement is also noted. The degree of displacement appears slightly worsened from the prior study. No new fractures are seen. The carpal rows appear grossly intact, and demonstrate normal alignment. Soft tissue swelling is noted, difficult to fully assess due to the overlying splint. IMPRESSION: Persistent mild shortening and 1 shaft width volar displacement of the distal radial diaphyseal fracture, with mild radial displacement also seen. The degree of displacement appears slightly worsened from the prior study. Electronically Signed   By: Roanna Raider M.D.   On: 03/21/2015 04:50   Dg Forearm  Left  03/21/2015  CLINICAL DATA:  25 year old male with fracture of distal forearm status post reduction. EXAM: LEFT FOREARM - 2 VIEW COMPARISON:  Earlier radiograph dated 03/21/2015 FINDINGS: There is fracture of the distal third of the radius with volar displacement of the distal fracture fragment similar to the prior study. There has been interval reduction of the radial dislocation of the distal fracture fragment of the radius. Bony fracture identified. A cast is noted over the forearm. IMPRESSION: Fracture of the distal third of the radius with volar displacement of the distal fracture fragment. Electronically Signed   By: Elgie Collard M.D.   On: 03/21/2015 04:01   Dg Forearm Left  03/21/2015  CLINICAL DATA:  Larey Seat and caught himself with arm. Left forearm pain and deformity. Initial encounter. EXAM: LEFT FOREARM - 2 VIEW COMPARISON:  None. FINDINGS: There is a displaced mildly comminuted fracture involving the distal radial diaphysis, with approximately 1 shaft width ulnar and volar displacement. Mild associated dorsal angulation is seen. No definite additional fracture is identified. A tiny osseous fragment adjacent to the ulnar styloid may reflect remote injury. Soft swelling is noted about the forearm. The elbow joint is unremarkable in appearance. No elbow joint effusion is seen. IMPRESSION: Displaced mildly comminuted fracture involving the distal radial diaphysis, with approximately 1 shaft width ulnar and volar displacement. Mild associated dorsal angulation seen. Electronically Signed   By: Roanna Raider M.D.   On: 03/21/2015 01:08    Review of Systems  Constitutional: Negative.   HENT: Negative.   Eyes: Negative.   Respiratory: Negative.   Gastrointestinal: Negative.  Genitourinary: Negative.   Skin: Negative.   Endo/Heme/Allergies: Negative.     Blood pressure 129/65, pulse 72, temperature 98.4 F (36.9 C), temperature source Oral, resp. rate 20, height  (1.778 m), weight  80.315 kg (177 lb 1 oz), SpO2 93 %. Physical Exam  Patient is alert and oriented. He is appropriate. He has no instability about the lower extremities or pain. Abdomen is nontender.  His left upper extremity is in a splint he has intact sensation and can move his fingers gently with intact refill. There is no evidence of a compartment syndrome at present time. Elbow is nontender according to his report.  I personally reviewed his x-rays which show a displaced mildly comminuted radial shaft fracture left upper extremity.  The patient is alert and oriented in no acute distress. The patient complains of pain in the affected upper extremity.  The patient is noted to have a normal HEENT exam. Lung fields show equal chest expansion and no shortness of breath. Abdomen exam is nontender without distention. Lower extremity examination does not show any fracture dislocation or blood clot symptoms. Pelvis is stable and the neck and back are stable and nontender. Assessment/Plan Status post fall with resultant distal third radial shaft fracture left upper extremity. Patient's neurovascular intact.  I recommend open reduction internal fixation as soon as possible as this is a closed injury and displaced.  I discussed elevation range of motion massage to the fingers.  We'll plan to proceed to the operative theater as soon as possible.  Patient understands risks and benefits and all issues.  We are planning surgery for your upper extremity. The risk and benefits of surgery to include risk of bleeding, infection, anesthesia,  damage to normal structures and failure of the surgery to accomplish its intended goals of relieving symptoms and restoring function have been discussed in detail. With this in mind we plan to proceed. I have specifically discussed with the patient the pre-and postoperative regime and the dos and don'ts and risk and benefits in great detail. Risk and benefits of surgery also include  risk of dystrophy(CRPS), chronic nerve pain, failure of the healing process to go onto completion and other inherent risks of surgery The relavent the pathophysiology of the disease/injury process, as well as the alternatives for treatment and postoperative course of action has been discussed in great detail with the patient who desires to proceed.  We will do everything in our power to help you (the patient) restore function to the upper extremity. It is a pleasure to see this patient today.   Karen Chafe, MD 03/21/2015, 6:49 AM

## 2015-03-21 NOTE — ED Notes (Signed)
MD and ortho at bedside to reduce fx.

## 2015-03-21 NOTE — ED Notes (Signed)
The pt has a sl deformity of his lt wrist forearm  He feel  And put out his arm to catch himself.  Wrist and forearm pain  Splinted in triage

## 2015-03-21 NOTE — Anesthesia Preprocedure Evaluation (Signed)
Anesthesia Evaluation  Patient identified by MRN, date of birth, ID band Patient awake    Reviewed: Allergy & Precautions, NPO status , Patient's Chart, lab work & pertinent test results  Airway Mallampati: I  TM Distance: >3 FB     Dental   Pulmonary    Pulmonary exam normal        Cardiovascular Normal cardiovascular exam     Neuro/Psych    GI/Hepatic   Endo/Other    Renal/GU      Musculoskeletal   Abdominal   Peds  Hematology   Anesthesia Other Findings   Reproductive/Obstetrics                             Anesthesia Physical Anesthesia Plan  ASA: I  Anesthesia Plan: General   Post-op Pain Management:    Induction: Intravenous  Airway Management Planned: LMA  Additional Equipment:   Intra-op Plan:   Post-operative Plan: Extubation in OR  Informed Consent: I have reviewed the patients History and Physical, chart, labs and discussed the procedure including the risks, benefits and alternatives for the proposed anesthesia with the patient or authorized representative who has indicated his/her understanding and acceptance.     Plan Discussed with: CRNA, Anesthesiologist and Surgeon  Anesthesia Plan Comments:         Anesthesia Quick Evaluation  

## 2015-03-21 NOTE — Op Note (Signed)
Lonnie Brock, REDMON NO.:  [id number]  MEDICAL RECORD NO.:  [id number]  LOCATION:  [id number]                        FACILITY:  [facility]  PHYSICIAN:  Lonnie Brock, M.D.DATE OF BIRTH:  12/15/1990  DATE OF PROCEDURE: DATE OF DISCHARGE:                              OPERATIVE REPORT   PREOPERATIVE DIAGNOSIS:  Left forearm fracture (radial shaft fracture) with median nerve distribution numbness, rule out evolving carpal tunnel syndrome.  POSTOPERATIVE DIAGNOSIS:  Left forearm fracture (radial shaft fracture) with median nerve distribution numbness, rule out evolving carpal tunnel syndrome.  PROCEDURE: 1. Open reduction and internal fixation, distal radius shaft fracture     at the distal 3rd of the radius.  This was fixated with a Zimmer     3.5 DCP stainless steel plate with locking technique at the ends of     the plate utilized. 2. AP lateral and oblique x-ray was performed, examined, interpreted     by myself, left forearm. 3. Open left carpal tunnel release.  SURGEON:  Lonnie Ano. Amanda Pea, MD  ASSISTANT:  None.  COMPLICATIONS:  None.  ANESTHESIA:  General.  TOURNIQUET TIME:  Less than an hour.  INDICATIONS FOR PROCEDURE:  This patient is 25 year old male, fell last night with a displaced fracture.  He has had some degree of evolving numbness and some plan to perform a carpal tunnel release.  I have discussed with him risks and benefits of surgery.  He has a displaced fracture, unstable in nature and it needs to be stabilized.  I have discussed with him these issues at length and the findings.  OPERATIVE PROCEDURE IN DETAIL:  The patient was seen by myself and Anesthesia, taken to the operative suite, underwent smooth induction of general anesthesia, prepped with a pre-scrub with Hibiclens followed by Betadine scrub and paint followed by a sterile field being secured. Time-out was observed.  Arm elevated.  Tourniquet was insufflated to 250 mmHg.   Incision was made volar radially.  Dissection was carried down in the interval of [name].  The FCR was retracted ulnarly and the radial artery was very carefully mobilized in more of a radial fashion given the distal 3rd nature.  I retracted the brachioradialis and superficial radial nerve radially as well.  We accessed the fracture, debrided it, and then reduced it and applied a Zimmer 3.5 DCP plate 7-hole in nature. The patient had compression mode used for the initial plate fixation and locking screws were placed at the end of the plate.  The plate ended just before the curvature of the radius in a volar direction and thus it looked perfect in terms of its alignment.  I stress tested the patient in the AP lateral and oblique planes, performed AP, lateral, and oblique x-rays and performed distal radioulnar joint evaluation.  The distal radioulnar joint looked excellent.  There were no complicating features. Following this, we then very carefully and cautiously performed irrigation followed by closure of the pronator quadratus with Vicryl followed by ultimate closure of the skin edge with 4 Prolene. Hemostasis was excellent.  A drain was not needed.  Following this, the patient had a 1 inch incision made of the transverse carpal ligament region,  I did not cross the distal wrist crease. Dissection was carried down very carefully and cautiously, identified the transverse carpal ligament which was released under 4.0 loupe magnification just off the edge of the hamate, this was an ulnar release.  There were no complicating features and all looked quite well. I was pleased with this in the findings.  Following release, we then irrigated, noted blood in the canal.  There was a small amount of blood, not a large amount.  We then irrigated and closed the wound after hemostasis was secured with bipolar electrocautery.  Following this, he was dressed with Adaptic, Xeroform, gauze, Kerlix, Kling,  Webril, and a long-arm splint with derotational slab.  He tolerated this well.  He will be admitted overnight for IV antibiotics, general postop observation, and other measures.  I will see him back in the office in 2 weeks.  We will plan for OT consult for range of motion and other measures.  These notes have been discussed and all questions have been encouraged and answered.     Lonnie Brock, M.D.     [facility]  D:  03/21/2015  T:  03/21/2015  Job:  [id number]

## 2015-03-22 ENCOUNTER — Encounter (HOSPITAL_COMMUNITY): Payer: Self-pay | Admitting: Orthopedic Surgery

## 2015-03-22 MED ORDER — METHOCARBAMOL 500 MG PO TABS: 500.0000 mg | ORAL_TABLET | Freq: Four times a day (QID) | ORAL | Status: AC | PRN

## 2015-03-22 MED ORDER — OXYCODONE HCL 5 MG PO TABS: 5.0000 mg | ORAL_TABLET | ORAL | Status: AC | PRN

## 2015-03-22 NOTE — Discharge Summary (Signed)
Physician Discharge Summary  Patient ID: Lonnie Brock MRN: [id number] DOB/AGE: 24/06/1990 24 y.o.  Admit date: 03/21/2015 Discharge date:   Admission Diagnoses: Left Forearm Fracture History reviewed. No pertinent past medical history.  Discharge Diagnoses:  Active Problems:   Radius distal fracture   Surgeries: Procedure(s): OPEN REDUCTION INTERNAL FIXATION (ORIF) DISTAL RADIAL FRACTURE on 03/21/2015    Consultants:  none  Discharged Condition: Improved  Hospital Course: Domanick Cuccia is an 25 y.o. male who was admitted 03/21/2015 with a chief complaint of Chief Complaint  Patient presents with  . Wrist Pain  , and found to have a diagnosis of Left Forearm Fracture.  They were brought to the operating room on 03/21/2015 and underwent Procedure(s): OPEN REDUCTION INTERNAL FIXATION (ORIF) DISTAL RADIAL FRACTURE.    They were given perioperative antibiotics: Anti-infectives    Start     Dose/Rate Route Frequency Ordered Stop   03/22/15 0000  ceFAZolin (ANCEF) IVPB 2 g/50 mL premix     2 g 100 mL/hr over 30 Minutes Intravenous Every 8 hours 03/21/15 2048      .  They were given sequential compression devices, early ambulation.  Recent vital signs: Patient Vitals for the past 24 hrs:  BP Temp Temp src Pulse Resp SpO2  03/22/15 0419 (!) 152/79 mmHg 99.1 F (37.3 C) - 60 16 100 %  03/22/15 0100 138/72 mmHg 99.1 F (37.3 C) Oral (!) 53 16 100 %  03/21/15 2100 (!) 165/83 mmHg 98.6 F (37 C) Oral 73 16 100 %  03/21/15 2020 (!) 151/80 mmHg - - 68 15 100 %  03/21/15 2010 - 99.7 F (37.6 C) - - - -  03/21/15 2003 (!) 145/84 mmHg - - 83 18 100 %  03/21/15 1948 (!) 159/94 mmHg - - 76 14 100 %  03/21/15 1945 (!) 159/93 mmHg - - 85 (!) 6 99 %  03/21/15 1939 (!) 169/98 mmHg - - 99 13 100 %  03/21/15 1933 (!) 175/90 mmHg - - 98 10 98 %  03/21/15 1918 (!) 168/91 mmHg - - 89 12 99 %  03/21/15 1903 (!) 171/75 mmHg - - 95 12 99 %  03/21/15 1848 (!) 150/102 mmHg - - 81 12 98 %  03/21/15  1833 (!) 157/90 mmHg - - 87 12 96 %  03/21/15 1820 - 97.9 F (36.6 C) - - - -  03/21/15 1818 (!) 161/88 mmHg - - (!) 117 17 99 %  03/21/15 1613 (!) 180/72 mmHg - - 72 20 100 %  .  Recent laboratory studies: Dg Forearm Left  03/21/2015  CLINICAL DATA:  Status post attempted reduction of left forearm fracture. Initial encounter. EXAM: LEFT FOREARM - 2 VIEW COMPARISON:  Left forearm radiographs performed earlier today at 3:49 a.m. FINDINGS: There is persistent mild shortening at the fracture site, with 1 shaft width volar displacement of the distal radial diaphyseal fracture. Mild radial displacement is also noted. The degree of displacement appears slightly worsened from the prior study. No new fractures are seen. The carpal rows appear grossly intact, and demonstrate normal alignment. Soft tissue swelling is noted, difficult to fully assess due to the overlying splint. IMPRESSION: Persistent mild shortening and 1 shaft width volar displacement of the distal radial diaphyseal fracture, with mild radial displacement also seen. The degree of displacement appears slightly worsened from the prior study. Electronically Signed   By: Roanna Raider M.D.   On: 03/21/2015 04:50   Dg Forearm Left  03/21/2015  CLINICAL DATA:  25 year old male with fracture of distal forearm status post reduction. EXAM: LEFT FOREARM - 2 VIEW COMPARISON:  Earlier radiograph dated 03/21/2015 FINDINGS: There is fracture of the distal third of the radius with volar displacement of the distal fracture fragment similar to the prior study. There has been interval reduction of the radial dislocation of the distal fracture fragment of the radius. Bony fracture identified. A cast is noted over the forearm. IMPRESSION: Fracture of the distal third of the radius with volar displacement of the distal fracture fragment. Electronically Signed   By: Elgie Collard M.D.   On: 03/21/2015 04:01   Dg Forearm Left  03/21/2015  CLINICAL DATA:  Larey Seat and  caught himself with arm. Left forearm pain and deformity. Initial encounter. EXAM: LEFT FOREARM - 2 VIEW COMPARISON:  None. FINDINGS: There is a displaced mildly comminuted fracture involving the distal radial diaphysis, with approximately 1 shaft width ulnar and volar displacement. Mild associated dorsal angulation is seen. No definite additional fracture is identified. A tiny osseous fragment adjacent to the ulnar styloid may reflect remote injury. Soft swelling is noted about the forearm. The elbow joint is unremarkable in appearance. No elbow joint effusion is seen. IMPRESSION: Displaced mildly comminuted fracture involving the distal radial diaphysis, with approximately 1 shaft width ulnar and volar displacement. Mild associated dorsal angulation seen. Electronically Signed   By: Roanna Raider M.D.   On: 03/21/2015 01:08    Discharge Medications:     Medication List    TAKE these medications        methocarbamol 500 MG tablet  Commonly known as:  ROBAXIN  Take 1 tablet (500 mg total) by mouth every 6 (six) hours as needed for muscle spasms.     oxyCODONE 5 MG immediate release tablet  Commonly known as:  Oxy IR/ROXICODONE  Take 1-2 tablets (5-10 mg total) by mouth every 3 (three) hours as needed for moderate pain.     oxyCODONE-acetaminophen 5-325 MG tablet  Commonly known as:  PERCOCET/ROXICET  Take 1 tablet by mouth every 6 (six) hours as needed for severe pain.        Diagnostic Studies: Dg Forearm Left  03/21/2015  CLINICAL DATA:  Status post attempted reduction of left forearm fracture. Initial encounter. EXAM: LEFT FOREARM - 2 VIEW COMPARISON:  Left forearm radiographs performed earlier today at 3:49 a.m. FINDINGS: There is persistent mild shortening at the fracture site, with 1 shaft width volar displacement of the distal radial diaphyseal fracture. Mild radial displacement is also noted. The degree of displacement appears slightly worsened from the prior study. No new fractures  are seen. The carpal rows appear grossly intact, and demonstrate normal alignment. Soft tissue swelling is noted, difficult to fully assess due to the overlying splint. IMPRESSION: Persistent mild shortening and 1 shaft width volar displacement of the distal radial diaphyseal fracture, with mild radial displacement also seen. The degree of displacement appears slightly worsened from the prior study. Electronically Signed   By: Roanna Raider M.D.   On: 03/21/2015 04:50   Dg Forearm Left  03/21/2015  CLINICAL DATA:  25 year old male with fracture of distal forearm status post reduction. EXAM: LEFT FOREARM - 2 VIEW COMPARISON:  Earlier radiograph dated 03/21/2015 FINDINGS: There is fracture of the distal third of the radius with volar displacement of the distal fracture fragment similar to the prior study. There has been interval reduction of the radial dislocation of the distal fracture fragment of the radius. Bony fracture identified. A cast is  noted over the forearm. IMPRESSION: Fracture of the distal third of the radius with volar displacement of the distal fracture fragment. Electronically Signed   By: Elgie Collard M.D.   On: 03/21/2015 04:01   Dg Forearm Left  03/21/2015  CLINICAL DATA:  Larey Seat and caught himself with arm. Left forearm pain and deformity. Initial encounter. EXAM: LEFT FOREARM - 2 VIEW COMPARISON:  None. FINDINGS: There is a displaced mildly comminuted fracture involving the distal radial diaphysis, with approximately 1 shaft width ulnar and volar displacement. Mild associated dorsal angulation is seen. No definite additional fracture is identified. A tiny osseous fragment adjacent to the ulnar styloid may reflect remote injury. Soft swelling is noted about the forearm. The elbow joint is unremarkable in appearance. No elbow joint effusion is seen. IMPRESSION: Displaced mildly comminuted fracture involving the distal radial diaphysis, with approximately 1 shaft width ulnar and volar  displacement. Mild associated dorsal angulation seen. Electronically Signed   By: Roanna Raider M.D.   On: 03/21/2015 01:08    They benefited maximally from their hospital stay and there were no complications.     Disposition: 01-Home or Self Care     Discharge Instructions    Call MD / Call 911    Complete by:  As directed   If you experience chest pain or shortness of breath, CALL 911 and be transported to the hospital emergency room.  If you develope a fever above 101 F, pus (white drainage) or increased drainage or redness at the wound, or calf pain, call your surgeon's office.     Constipation Prevention    Complete by:  As directed   Drink plenty of fluids.  Prune juice may be helpful.  You may use a stool softener, such as Colace (over the counter) 100 mg twice a day.  Use MiraLax (over the counter) for constipation as needed.     Diet - low sodium heart healthy    Complete by:  As directed      Discharge instructions    Complete by:  As directed   .We recommend that you to take vitamin C 1000 mg a day to promote healing. We also recommend that if you require  pain medicine that you take a stool softener to prevent constipation as most pain medicines will have constipation side effects. We recommend either Peri-Colace or Senokot and recommend that you also consider adding MiraLAX as well to prevent the constipation affects from pain medicine if you are required to use them. These medicines are over the counter and may be purchased at a local pharmacy. A cup of yogurt and a probiotic can also be helpful during the recovery process as the medicines can disrupt your intestinal environment.  Marland KitchenKeep bandage clean and dry.  Call for any problems.  No smoking.  Criteria for driving a car: you should be off your pain medicine for 7-8 hours, able to drive one handed(confident), thinking clearly and feeling able in your judgement to drive. Continue elevation as it will decrease swelling.  If  instructed by MD move your fingers within the confines of the bandage/splint.  Use ice if instructed by your MD. Call immediately for any sudden loss of feeling in your hand/arm or change in functional abilities of the extremity.     Increase activity slowly as tolerated    Complete by:  As directed           Follow-up Information    Follow up with Karen Chafe,  MD.   Specialty:  Orthopedic Surgery   Why:  As instructed by Dr. Emmie Niemann information:   [address] [place] [address] [phone]       Follow up with Karen Chafe, MD.   Specialty:  Orthopedic Surgery   Why:  Our office will contact you with follow-up date and time, he will need to see [place] in approximately 10-12 days   Contact information:   [address] [place] [address] [phone]        Signed: Sheran Lawless 03/22/2015, 1:00 PM

## 2015-03-22 NOTE — Progress Notes (Signed)
Occupational Therapy Treatment Patient Details Name: Lonnie Brock MRN: [id number] DOB: Dec 08, 1990 Today's Date: 03/22/2015    History of present illness Lonnie Brock is a 25 y.o. male who presents to the Emergency Department complaining of sudden-onset, constant, 10/10 left forearm pain S/P a FOOSH injury that occurred PTA. He states he landed with all of the impact on his outstretched left arm. Patient states he has not taken any medications for his symptoms. Patient is right-hand-dominant. He denies a history of any chronic medical conditions or regular medications. Patient has NKDA.    OT comments  Pt tolerated OT intervention well this session. OT providing education regarding self care, L hand HEP, sling use, and review precautions again. Pt demonstrated understanding and engaged in dressing task for upcoming discharge.   Follow Up Recommendations  Supervision - Intermittent    Equipment Recommendations       Recommendations for Other Services      Precautions / Restrictions Precautions Precautions: Fall;Other (comment) Precaution Comments: L UE in sling when OOB Required Braces or Orthoses: Sling Restrictions Weight Bearing Restrictions: No LUE Weight Bearing: Non weight bearing Other Position/Activity Restrictions: keep elevated for edema control        Mobility Bed Mobility Overal bed mobility: Independent        Transfers Overall transfer level: Independent Equipment used: None           Balance Overall balance assessment: Independent           ADL Overall ADL's : Needs assistance/impaired         Functional mobility during ADLs: Independent General ADL Comments: Pt planning for discharge today with pt having many questions regarding the best way to dress to leave hospital. Pt's family brought clothing as recommended from last session and therapist provided min verbal cues for dressing tasks in order to don clothing whille not pushing,pulling, or  lifting  LUE. OT also demonstrated how to clean armpit and apply deodorant safely.  OT also providing verbal guidance cues for how to don L UE sling  and pt returned demonstration independently.  Reviewed  when to where sling, how to prop when sleeping, and reviewed HEP for L digit exercises.                 Cognition   Behavior During Therapy: WFL for tasks assessed/performed Overall Cognitive Status: Within Functional Limits for tasks assessed                                    Pertinent Vitals/ Pain       Pain Assessment: 0-10 Pain Score: 4  Pain Location: L UE Pain Descriptors / Indicators: Aching Pain Intervention(s): Limited activity within patient's tolerance;Monitored during session;Repositioned         Frequency Min 2X/week     Progress Toward Goals  OT Goals(current goals can now be found in the care plan section)  Progress towards OT goals: Progressing toward goals     Plan Discharge plan remains appropriate       End of Session Equipment Utilized During Treatment: Other (comment) (sling)   Activity Tolerance Patient tolerated treatment well   Patient Left in bed;with call bell/phone within reach   Nurse Communication Other (comment) (disconnected IV and aware that pt is getting dressed for d/c)        Time: 4098-1191 OT Time Calculation (min): 17 min  Charges: OT General Charges $OT  Visit: 1 Procedure OT Treatments $Self Care/Home Management : 8-22 mins  Lowella Grip, MS, OTR/L 03/22/2015, 2:22 PM

## 2015-03-22 NOTE — Progress Notes (Signed)
Pt ready for discharge. Education/instructions reviewed with pt and all questions/concerns addressed. IV removed and belongings gathered. Pt will be transported out via wheelchair to girlfriend's car. Will continue to monitor.

## 2015-03-22 NOTE — Anesthesia Postprocedure Evaluation (Signed)
Anesthesia Post Note  Patient: Lonnie Brock  Procedure(s) Performed: Procedure(s) (LRB): OPEN REDUCTION INTERNAL FIXATION (ORIF) DISTAL RADIAL FRACTURE (Left)  Patient location during evaluation: PACU Anesthesia Type: General Level of consciousness: awake, awake and alert, oriented and patient cooperative Pain management: pain level controlled Vital Signs Assessment: post-procedure vital signs reviewed and stable Respiratory status: spontaneous breathing and respiratory function stable Cardiovascular status: blood pressure returned to baseline and stable Anesthetic complications: no    Last Vitals:  Filed Vitals:   03/22/15 0100 03/22/15 0419  BP: 138/72 152/79  Pulse: 53 60  Temp: 37.3 C 37.3 C  Resp: 16 16    Last Pain:  Filed Vitals:   03/22/15 0717  PainSc: 8                  Grete Bosko EDWARD

## 2015-03-22 NOTE — Discharge Instructions (Signed)
°.  We recommend that you to take vitamin C 1000 mg a day to promote healing. We also recommend that if you require  pain medicine that you take a stool softener to prevent constipation as most pain medicines will have constipation side effects. We recommend either Peri-Colace or Senokot and recommend that you also consider adding MiraLAX as well to prevent the constipation affects from pain medicine if you are required to use them. These medicines are over the counter and may be purchased at a local pharmacy. A cup of yogurt and a probiotic can also be helpful during the recovery process as the medicines can disrupt your intestinal environment. .We recommend that you to take vitamin C 1000 mg a day to promote healing. We also recommend that if you require  pain medicine that you take a stool softener to prevent constipation as most pain medicines will have constipation side effects. We recommend either Peri-Colace or Senokot and recommend that you also consider adding MiraLAX as well to prevent the constipation affects from pain medicine if you are required to use them. These medicines are over the counter and may be purchased at a local pharmacy. A cup of yogurt and a probiotic can also be helpful during the recovery process as the medicines can disrupt your intestinal environment. Marland KitchenKeep bandage clean and dry.  Call for any problems.  No smoking.  Criteria for driving a car: you should be off your pain medicine for 7-8 hours, able to drive one handed(confident), thinking clearly and feeling able in your judgement to drive. Continue elevation as it will decrease swelling.  If instructed by MD move your fingers within the confines of the bandage/splint.  Use ice if instructed by your MD. Call immediately for any sudden loss of feeling in your hand/arm or change in functional abilities of the extremity.

## 2015-03-24 ENCOUNTER — Encounter (HOSPITAL_COMMUNITY): Payer: Self-pay | Admitting: Orthopedic Surgery

## 2016-11-10 IMAGING — CR DG FOREARM 2V*L*
2 series · 2 of 2 positions shown · non-contrast
Comparison: Left forearm radiographs performed earlier today at
[DATE] a.m.

CLINICAL DATA: Status post attempted reduction of left forearm
fracture. Initial encounter.

EXAM:
LEFT FOREARM - 2 VIEW

[AP]
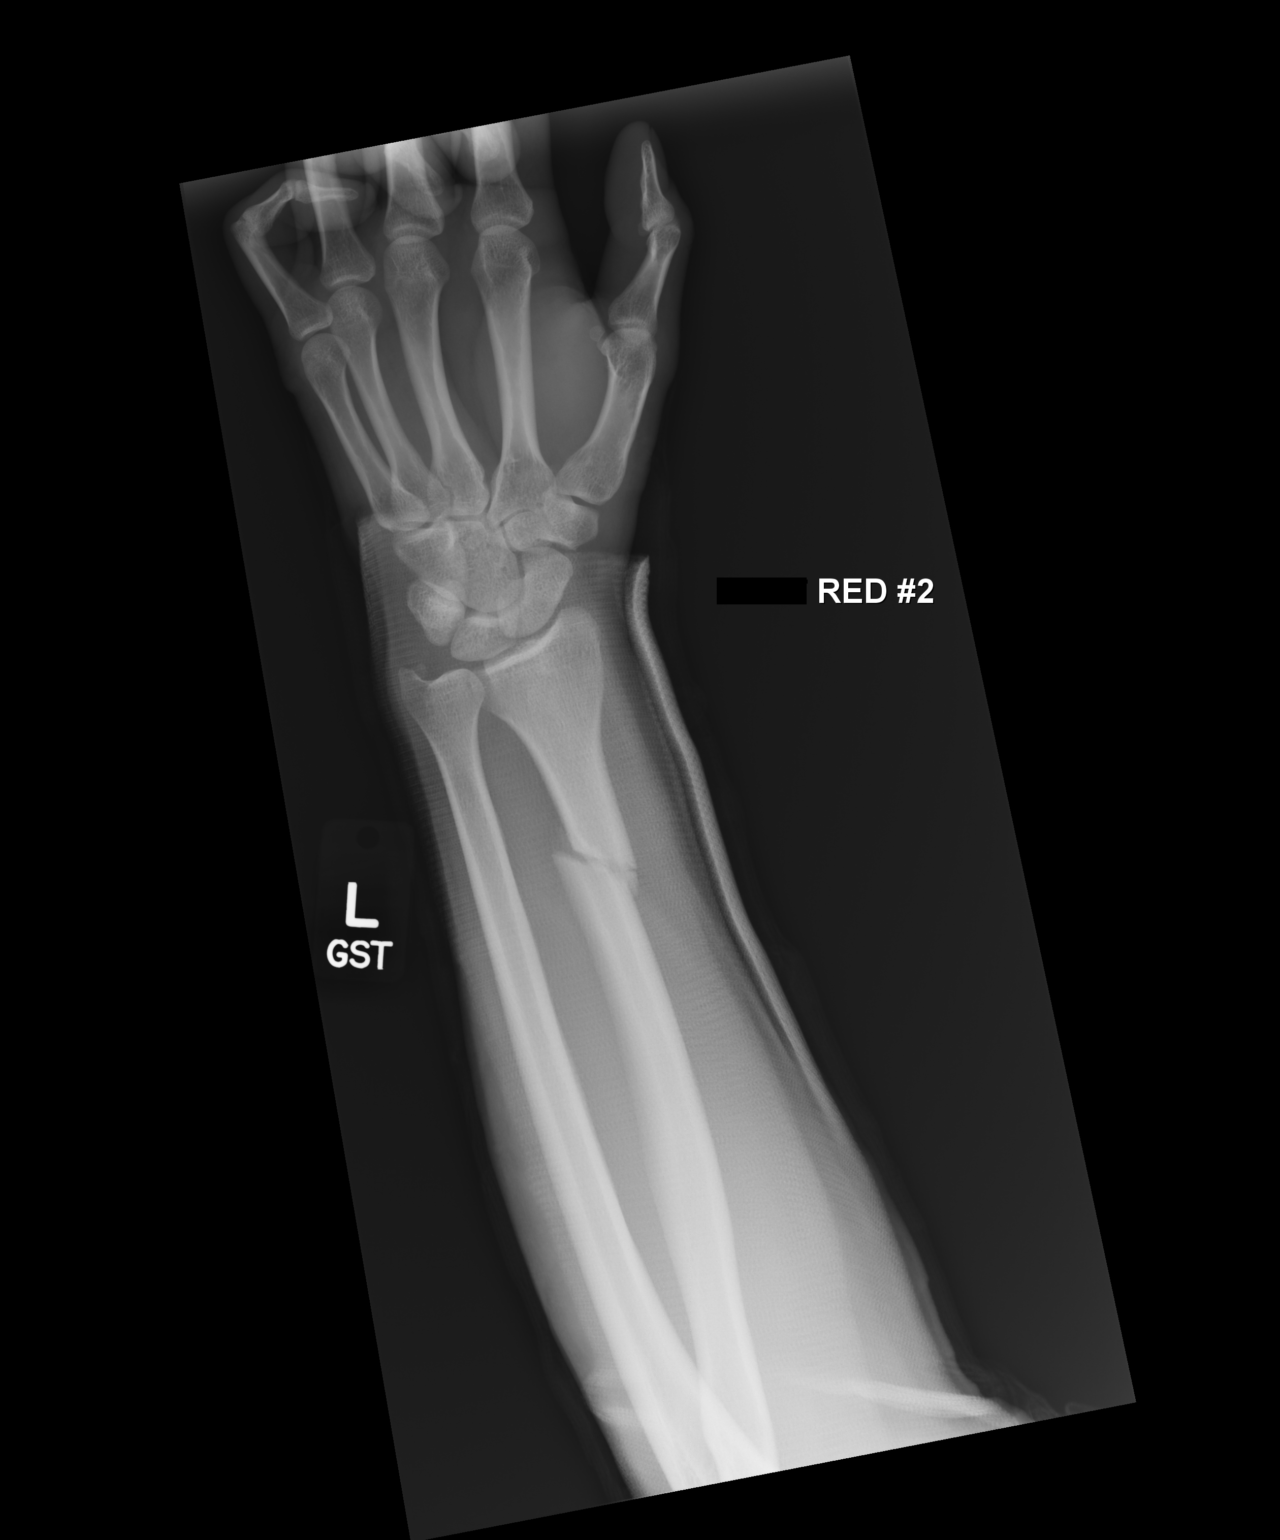

[lateral]
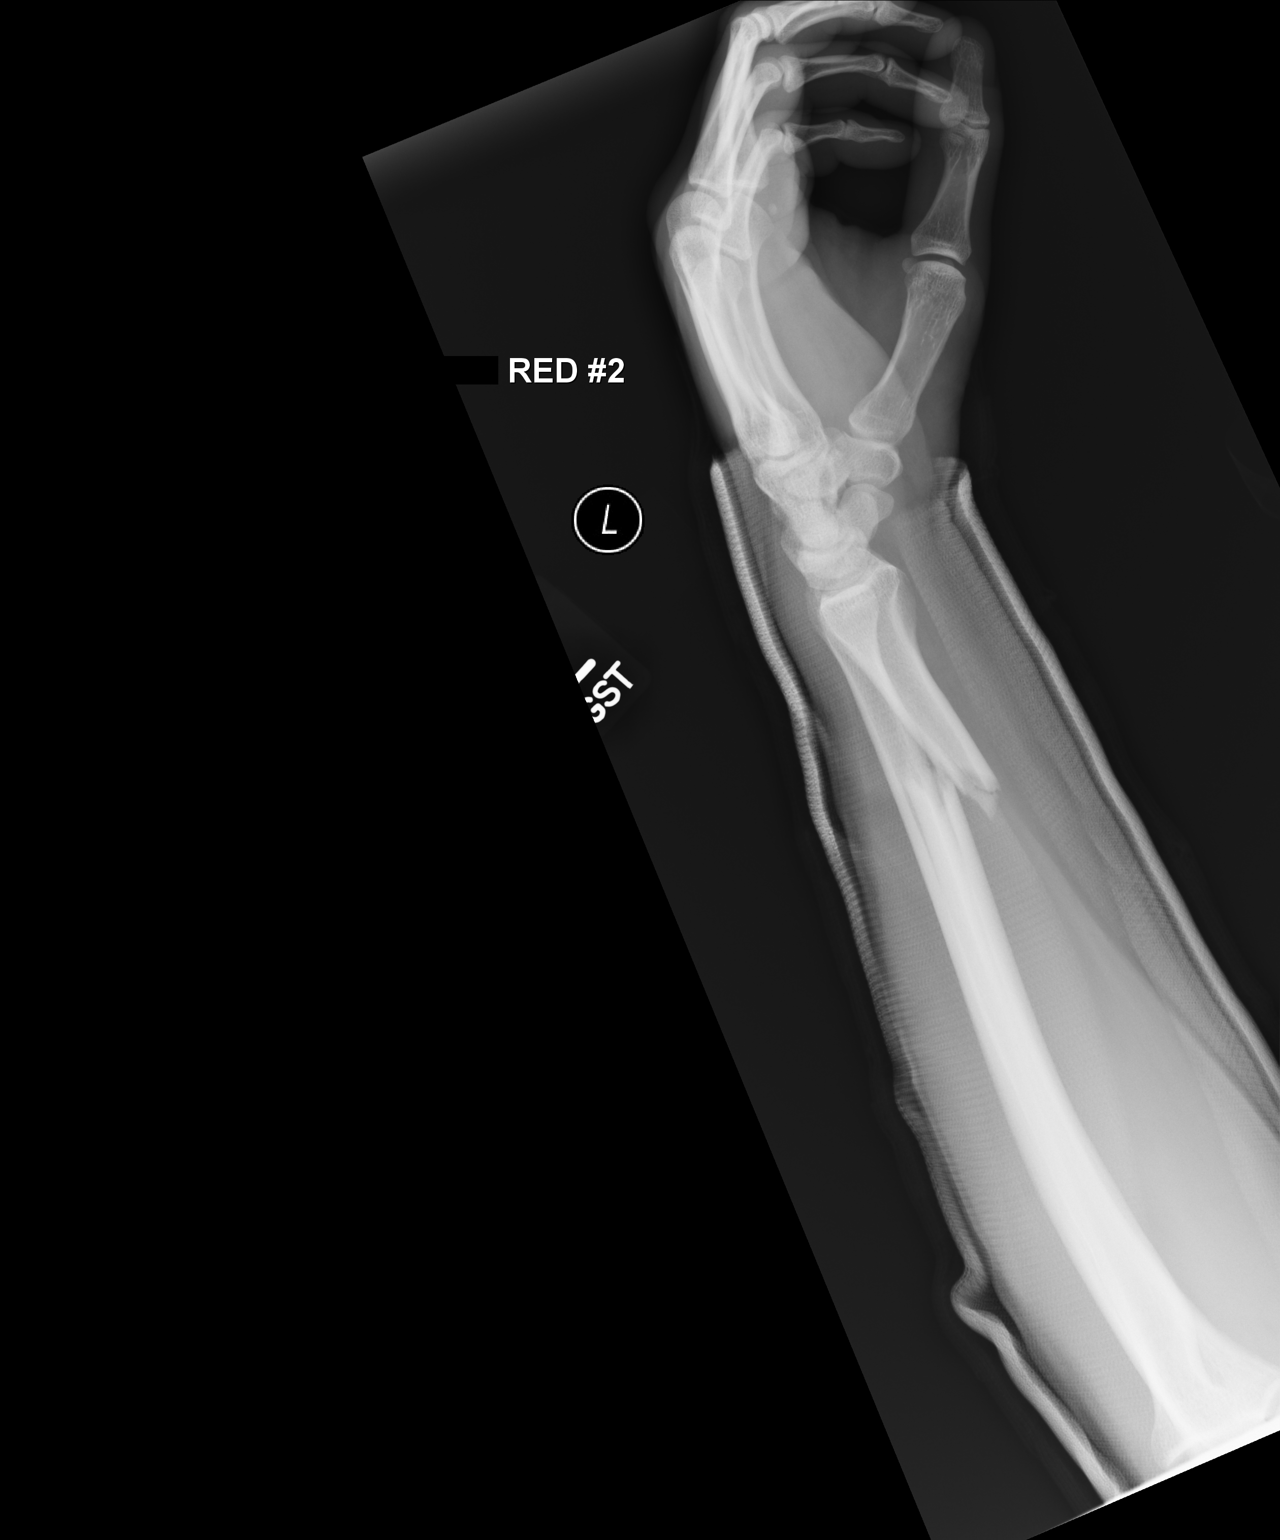

[2 of 2 positions shown; findings below may reference images not displayed]

FINDINGS: There is persistent mild shortening at the fracture site, with 1
shaft width volar displacement of the distal radial diaphyseal
fracture. Mild radial displacement is also noted. The degree of
displacement appears slightly worsened from the prior study.

No new fractures are seen. The carpal rows appear grossly intact,
and demonstrate normal alignment. Soft tissue swelling is noted,
difficult to fully assess due to the overlying splint.
IMPRESSION: Persistent mild shortening and 1 shaft width volar displacement of
the distal radial diaphyseal fracture, with mild radial displacement
also seen. The degree of displacement appears slightly worsened from
the prior study.

## 2016-11-10 IMAGING — CR DG FOREARM 2V*L*
5 series · 5 of 5 positions shown · non-contrast
Comparison: None.

CLINICAL DATA: Fell and caught himself with arm. Left forearm pain
and deformity. Initial encounter.

EXAM:
LEFT FOREARM - 2 VIEW

[forearm ap (1 of 3)]
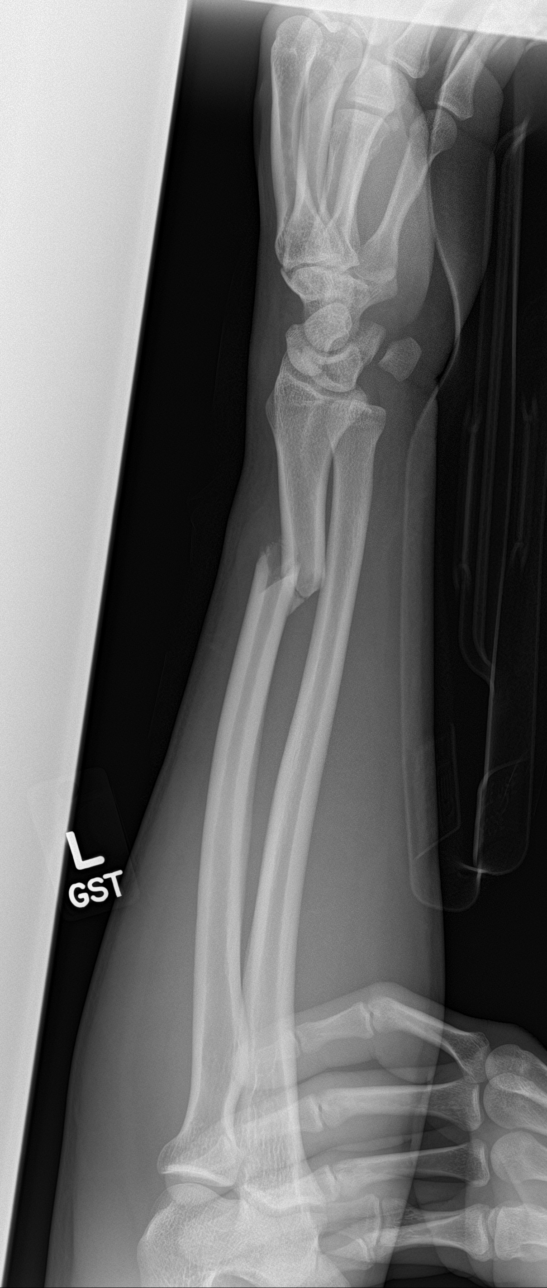

[forearm lat (1 of 2)]
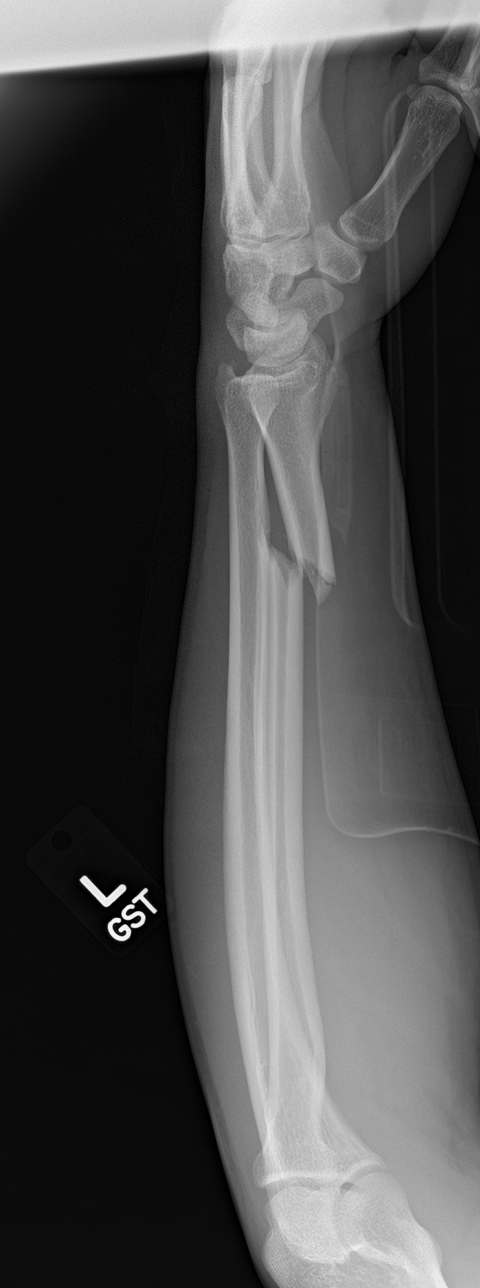

[forearm lat (2 of 2)]
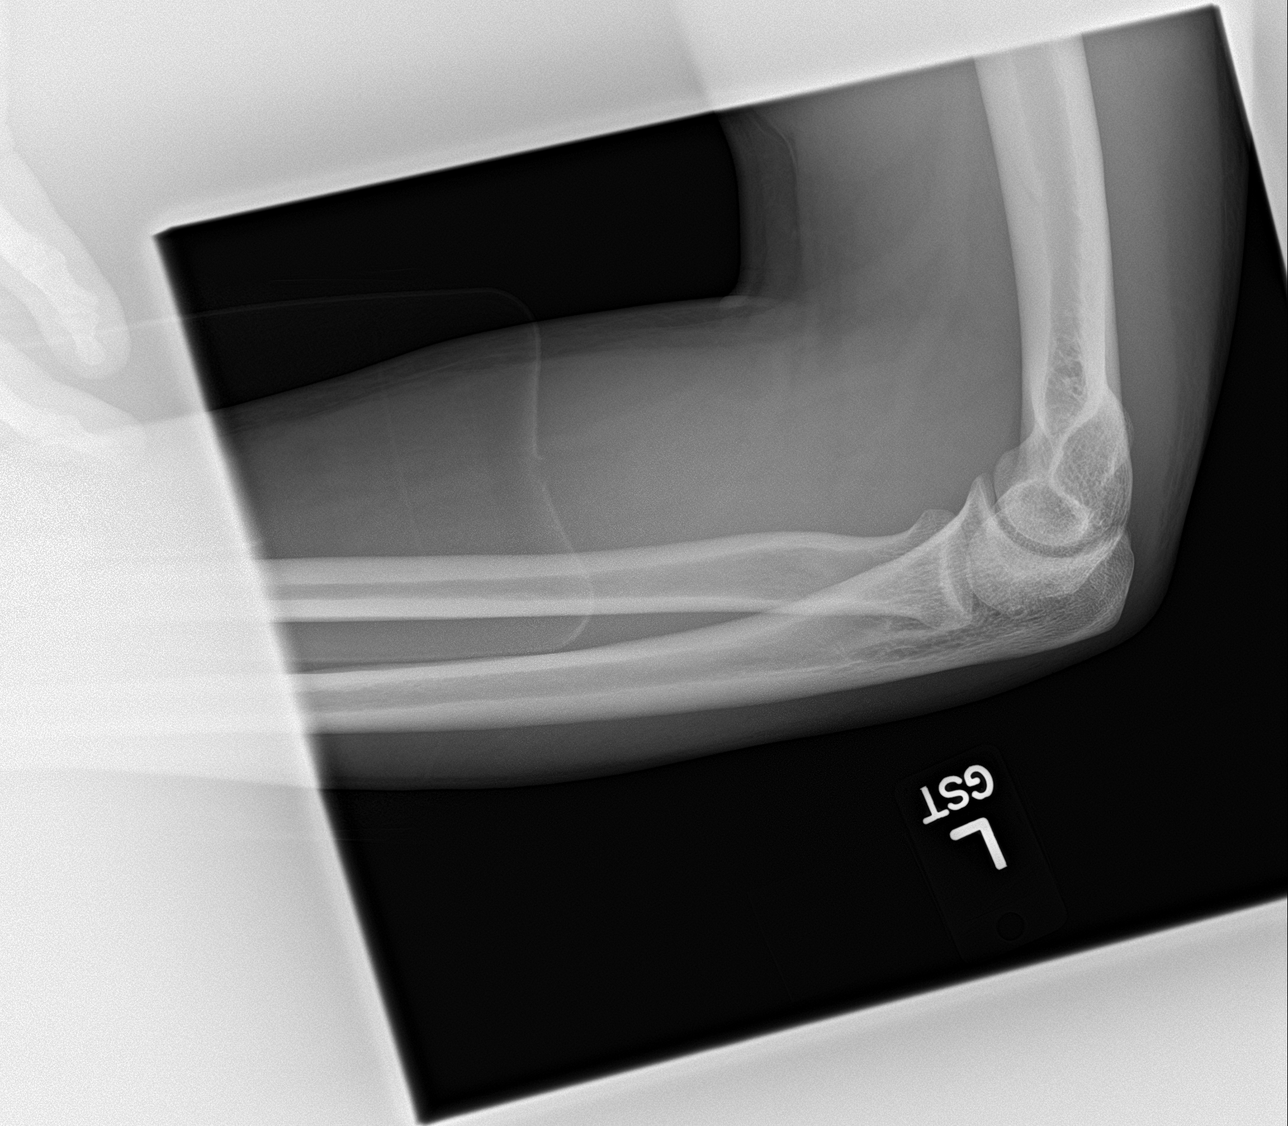

[forearm ap (2 of 3)]
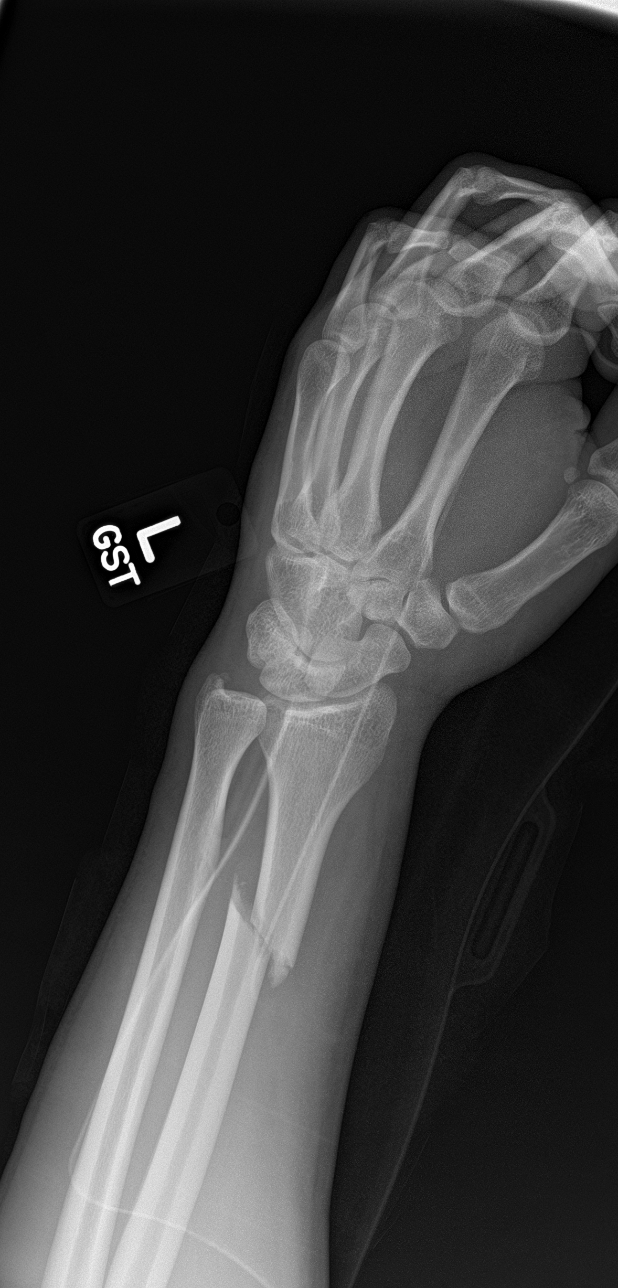

[forearm ap (3 of 3)]
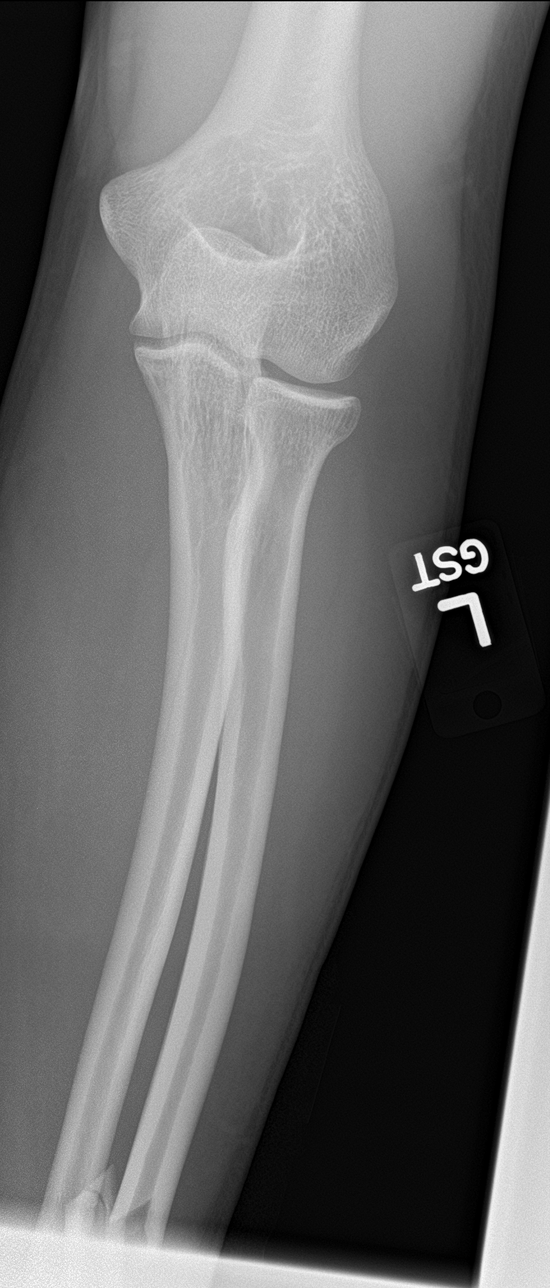

[5 of 5 positions shown; findings below may reference images not displayed]

FINDINGS: There is a displaced mildly comminuted fracture involving the distal
radial diaphysis, with approximately 1 shaft width ulnar and volar
displacement. Mild associated dorsal angulation is seen. No definite
additional fracture is identified. A tiny osseous fragment adjacent
to the ulnar styloid may reflect remote injury.

Soft swelling is noted about the forearm. The elbow joint is
unremarkable in appearance. No elbow joint effusion is seen.
IMPRESSION: Displaced mildly comminuted fracture involving the distal radial
diaphysis, with approximately 1 shaft width ulnar and volar
displacement. Mild associated dorsal angulation seen.
